# Patient Record
Sex: Male | Born: 1940 | Race: Black or African American | Hispanic: No | Smoking: Former smoker
Health system: Southern US, Community
[De-identification: ages and names within clinical notes are randomized; demographics above are authoritative.]

## PROBLEM LIST (undated history)

## (undated) DIAGNOSIS — C61 Malignant neoplasm of prostate: Secondary | ICD-10-CM

## (undated) DIAGNOSIS — J439 Emphysema, unspecified: Secondary | ICD-10-CM

## (undated) DIAGNOSIS — M109 Gout, unspecified: Secondary | ICD-10-CM

## (undated) HISTORY — DX: Malignant neoplasm of prostate: C61

## (undated) HISTORY — DX: Emphysema, unspecified: J43.9

## (undated) HISTORY — DX: Gout, unspecified: M10.9

## (undated) HISTORY — PX: EYE SURGERY: SHX253

---

## 2016-06-10 DIAGNOSIS — J431 Panlobular emphysema: Secondary | ICD-10-CM | POA: Insufficient documentation

## 2016-09-09 DIAGNOSIS — M722 Plantar fascial fibromatosis: Secondary | ICD-10-CM | POA: Insufficient documentation

## 2016-09-09 DIAGNOSIS — R251 Tremor, unspecified: Secondary | ICD-10-CM | POA: Insufficient documentation

## 2017-12-18 DIAGNOSIS — C61 Malignant neoplasm of prostate: Secondary | ICD-10-CM | POA: Diagnosis not present

## 2017-12-18 DIAGNOSIS — N3020 Other chronic cystitis without hematuria: Secondary | ICD-10-CM | POA: Diagnosis not present

## 2017-12-25 DIAGNOSIS — J449 Chronic obstructive pulmonary disease, unspecified: Secondary | ICD-10-CM | POA: Diagnosis not present

## 2018-01-25 DIAGNOSIS — J449 Chronic obstructive pulmonary disease, unspecified: Secondary | ICD-10-CM | POA: Diagnosis not present

## 2018-02-22 DIAGNOSIS — J449 Chronic obstructive pulmonary disease, unspecified: Secondary | ICD-10-CM | POA: Diagnosis not present

## 2018-04-07 DIAGNOSIS — C61 Malignant neoplasm of prostate: Secondary | ICD-10-CM | POA: Diagnosis not present

## 2018-04-07 DIAGNOSIS — E782 Mixed hyperlipidemia: Secondary | ICD-10-CM | POA: Diagnosis not present

## 2018-04-07 DIAGNOSIS — I10 Essential (primary) hypertension: Secondary | ICD-10-CM | POA: Diagnosis not present

## 2018-04-07 DIAGNOSIS — R7301 Impaired fasting glucose: Secondary | ICD-10-CM | POA: Diagnosis not present

## 2018-04-07 DIAGNOSIS — M1000 Idiopathic gout, unspecified site: Secondary | ICD-10-CM | POA: Diagnosis not present

## 2018-04-07 DIAGNOSIS — R7303 Prediabetes: Secondary | ICD-10-CM | POA: Diagnosis not present

## 2018-04-07 DIAGNOSIS — J449 Chronic obstructive pulmonary disease, unspecified: Secondary | ICD-10-CM | POA: Diagnosis not present

## 2018-04-19 DIAGNOSIS — N3020 Other chronic cystitis without hematuria: Secondary | ICD-10-CM | POA: Diagnosis not present

## 2018-04-19 DIAGNOSIS — C61 Malignant neoplasm of prostate: Secondary | ICD-10-CM | POA: Diagnosis not present

## 2018-04-23 DIAGNOSIS — I129 Hypertensive chronic kidney disease with stage 1 through stage 4 chronic kidney disease, or unspecified chronic kidney disease: Secondary | ICD-10-CM | POA: Diagnosis not present

## 2018-04-23 DIAGNOSIS — I9589 Other hypotension: Secondary | ICD-10-CM | POA: Diagnosis not present

## 2018-04-23 DIAGNOSIS — I959 Hypotension, unspecified: Secondary | ICD-10-CM | POA: Diagnosis not present

## 2018-04-23 DIAGNOSIS — N189 Chronic kidney disease, unspecified: Secondary | ICD-10-CM | POA: Diagnosis not present

## 2018-04-23 DIAGNOSIS — R0902 Hypoxemia: Secondary | ICD-10-CM | POA: Diagnosis not present

## 2018-04-23 DIAGNOSIS — J449 Chronic obstructive pulmonary disease, unspecified: Secondary | ICD-10-CM | POA: Diagnosis not present

## 2018-04-23 DIAGNOSIS — N289 Disorder of kidney and ureter, unspecified: Secondary | ICD-10-CM | POA: Diagnosis not present

## 2018-04-23 DIAGNOSIS — R0602 Shortness of breath: Secondary | ICD-10-CM | POA: Diagnosis not present

## 2018-04-23 DIAGNOSIS — I951 Orthostatic hypotension: Secondary | ICD-10-CM | POA: Diagnosis not present

## 2018-04-23 DIAGNOSIS — R531 Weakness: Secondary | ICD-10-CM | POA: Diagnosis not present

## 2018-04-23 DIAGNOSIS — R51 Headache: Secondary | ICD-10-CM | POA: Diagnosis not present

## 2018-07-25 DIAGNOSIS — J449 Chronic obstructive pulmonary disease, unspecified: Secondary | ICD-10-CM | POA: Diagnosis not present

## 2018-08-09 DIAGNOSIS — R7303 Prediabetes: Secondary | ICD-10-CM | POA: Diagnosis not present

## 2018-08-09 DIAGNOSIS — J449 Chronic obstructive pulmonary disease, unspecified: Secondary | ICD-10-CM | POA: Diagnosis not present

## 2018-08-09 DIAGNOSIS — I10 Essential (primary) hypertension: Secondary | ICD-10-CM | POA: Diagnosis not present

## 2018-08-09 DIAGNOSIS — Z6826 Body mass index (BMI) 26.0-26.9, adult: Secondary | ICD-10-CM | POA: Diagnosis not present

## 2018-08-09 DIAGNOSIS — E782 Mixed hyperlipidemia: Secondary | ICD-10-CM | POA: Diagnosis not present

## 2018-08-09 DIAGNOSIS — N183 Chronic kidney disease, stage 3 (moderate): Secondary | ICD-10-CM | POA: Diagnosis not present

## 2018-08-23 DIAGNOSIS — N3020 Other chronic cystitis without hematuria: Secondary | ICD-10-CM | POA: Diagnosis not present

## 2018-08-23 DIAGNOSIS — C61 Malignant neoplasm of prostate: Secondary | ICD-10-CM | POA: Diagnosis not present

## 2018-08-25 DIAGNOSIS — J449 Chronic obstructive pulmonary disease, unspecified: Secondary | ICD-10-CM | POA: Diagnosis not present

## 2018-09-24 DIAGNOSIS — J449 Chronic obstructive pulmonary disease, unspecified: Secondary | ICD-10-CM | POA: Diagnosis not present

## 2018-10-25 DIAGNOSIS — J449 Chronic obstructive pulmonary disease, unspecified: Secondary | ICD-10-CM | POA: Diagnosis not present

## 2018-11-16 DIAGNOSIS — J9601 Acute respiratory failure with hypoxia: Secondary | ICD-10-CM | POA: Diagnosis not present

## 2018-11-16 DIAGNOSIS — J441 Chronic obstructive pulmonary disease with (acute) exacerbation: Secondary | ICD-10-CM | POA: Diagnosis not present

## 2018-11-16 DIAGNOSIS — R0902 Hypoxemia: Secondary | ICD-10-CM | POA: Diagnosis not present

## 2018-11-18 DIAGNOSIS — J9601 Acute respiratory failure with hypoxia: Secondary | ICD-10-CM | POA: Diagnosis not present

## 2018-11-18 DIAGNOSIS — J441 Chronic obstructive pulmonary disease with (acute) exacerbation: Secondary | ICD-10-CM | POA: Diagnosis not present

## 2018-11-24 DIAGNOSIS — J449 Chronic obstructive pulmonary disease, unspecified: Secondary | ICD-10-CM | POA: Diagnosis not present

## 2018-11-25 DIAGNOSIS — Z0000 Encounter for general adult medical examination without abnormal findings: Secondary | ICD-10-CM | POA: Diagnosis not present

## 2018-11-25 DIAGNOSIS — N3941 Urge incontinence: Secondary | ICD-10-CM | POA: Diagnosis not present

## 2018-12-01 DIAGNOSIS — J449 Chronic obstructive pulmonary disease, unspecified: Secondary | ICD-10-CM | POA: Diagnosis not present

## 2018-12-23 DIAGNOSIS — N3020 Other chronic cystitis without hematuria: Secondary | ICD-10-CM | POA: Diagnosis not present

## 2018-12-23 DIAGNOSIS — C61 Malignant neoplasm of prostate: Secondary | ICD-10-CM | POA: Diagnosis not present

## 2018-12-25 DIAGNOSIS — J449 Chronic obstructive pulmonary disease, unspecified: Secondary | ICD-10-CM | POA: Diagnosis not present

## 2019-01-24 DIAGNOSIS — C61 Malignant neoplasm of prostate: Secondary | ICD-10-CM | POA: Diagnosis not present

## 2019-01-24 DIAGNOSIS — N3020 Other chronic cystitis without hematuria: Secondary | ICD-10-CM | POA: Diagnosis not present

## 2019-01-25 DIAGNOSIS — J449 Chronic obstructive pulmonary disease, unspecified: Secondary | ICD-10-CM | POA: Diagnosis not present

## 2019-02-23 DIAGNOSIS — J449 Chronic obstructive pulmonary disease, unspecified: Secondary | ICD-10-CM | POA: Diagnosis not present

## 2019-03-26 DIAGNOSIS — J449 Chronic obstructive pulmonary disease, unspecified: Secondary | ICD-10-CM | POA: Diagnosis not present

## 2019-04-05 DIAGNOSIS — N183 Chronic kidney disease, stage 3 (moderate): Secondary | ICD-10-CM | POA: Diagnosis not present

## 2019-04-05 DIAGNOSIS — I10 Essential (primary) hypertension: Secondary | ICD-10-CM | POA: Diagnosis not present

## 2019-04-05 DIAGNOSIS — R7303 Prediabetes: Secondary | ICD-10-CM | POA: Diagnosis not present

## 2019-04-05 DIAGNOSIS — C61 Malignant neoplasm of prostate: Secondary | ICD-10-CM | POA: Diagnosis not present

## 2019-04-05 DIAGNOSIS — Z1159 Encounter for screening for other viral diseases: Secondary | ICD-10-CM | POA: Diagnosis not present

## 2019-04-05 DIAGNOSIS — J449 Chronic obstructive pulmonary disease, unspecified: Secondary | ICD-10-CM | POA: Diagnosis not present

## 2019-04-05 DIAGNOSIS — E782 Mixed hyperlipidemia: Secondary | ICD-10-CM | POA: Diagnosis not present

## 2019-04-26 DIAGNOSIS — J449 Chronic obstructive pulmonary disease, unspecified: Secondary | ICD-10-CM | POA: Diagnosis not present

## 2019-04-26 DIAGNOSIS — N3020 Other chronic cystitis without hematuria: Secondary | ICD-10-CM | POA: Diagnosis not present

## 2019-04-26 DIAGNOSIS — C61 Malignant neoplasm of prostate: Secondary | ICD-10-CM | POA: Diagnosis not present

## 2019-05-27 DIAGNOSIS — J449 Chronic obstructive pulmonary disease, unspecified: Secondary | ICD-10-CM | POA: Diagnosis not present

## 2019-08-30 DIAGNOSIS — N3000 Acute cystitis without hematuria: Secondary | ICD-10-CM | POA: Diagnosis not present

## 2019-08-30 DIAGNOSIS — C61 Malignant neoplasm of prostate: Secondary | ICD-10-CM | POA: Diagnosis not present

## 2019-09-01 DIAGNOSIS — C61 Malignant neoplasm of prostate: Secondary | ICD-10-CM | POA: Diagnosis not present

## 2019-11-07 DIAGNOSIS — J449 Chronic obstructive pulmonary disease, unspecified: Secondary | ICD-10-CM | POA: Diagnosis not present

## 2019-11-07 DIAGNOSIS — N3941 Urge incontinence: Secondary | ICD-10-CM | POA: Diagnosis not present

## 2019-11-07 DIAGNOSIS — Z1159 Encounter for screening for other viral diseases: Secondary | ICD-10-CM | POA: Diagnosis not present

## 2019-12-13 DIAGNOSIS — J449 Chronic obstructive pulmonary disease, unspecified: Secondary | ICD-10-CM | POA: Diagnosis not present

## 2019-12-13 DIAGNOSIS — F322 Major depressive disorder, single episode, severe without psychotic features: Secondary | ICD-10-CM | POA: Diagnosis not present

## 2019-12-13 DIAGNOSIS — Z1159 Encounter for screening for other viral diseases: Secondary | ICD-10-CM | POA: Diagnosis not present

## 2019-12-15 ENCOUNTER — Ambulatory Visit (INDEPENDENT_AMBULATORY_CARE_PROVIDER_SITE_OTHER): Payer: PPO | Admitting: Critical Care Medicine

## 2019-12-15 ENCOUNTER — Other Ambulatory Visit: Payer: Self-pay

## 2019-12-15 ENCOUNTER — Encounter: Payer: Self-pay | Admitting: Critical Care Medicine

## 2019-12-15 VITALS — BP 142/78 | HR 97 | Temp 97.2°F | Ht 68.0 in | Wt 170.0 lb

## 2019-12-15 DIAGNOSIS — R062 Wheezing: Secondary | ICD-10-CM | POA: Diagnosis not present

## 2019-12-15 DIAGNOSIS — J9611 Chronic respiratory failure with hypoxia: Secondary | ICD-10-CM | POA: Diagnosis not present

## 2019-12-15 DIAGNOSIS — J449 Chronic obstructive pulmonary disease, unspecified: Secondary | ICD-10-CM

## 2019-12-15 DIAGNOSIS — R0600 Dyspnea, unspecified: Secondary | ICD-10-CM | POA: Diagnosis not present

## 2019-12-15 DIAGNOSIS — R0609 Other forms of dyspnea: Secondary | ICD-10-CM

## 2019-12-15 MED ORDER — STIOLTO RESPIMAT 2.5-2.5 MCG/ACT IN AERS: 2.0000 | INHALATION_SPRAY | Freq: Every day | RESPIRATORY_TRACT | 0 refills | Status: DC

## 2019-12-15 MED ORDER — STIOLTO RESPIMAT 2.5-2.5 MCG/ACT IN AERS: 2.0000 | INHALATION_SPRAY | Freq: Every day | RESPIRATORY_TRACT | 11 refills | Status: DC

## 2019-12-15 MED ORDER — ALBUTEROL SULFATE HFA 108 (90 BASE) MCG/ACT IN AERS: 2.0000 | INHALATION_SPRAY | RESPIRATORY_TRACT | 5 refills | Status: DC | PRN

## 2019-12-15 NOTE — Addendum Note (Signed)
Addended by: Jannette Spanner on: 12/15/2019 11:50 AM   Modules accepted: Orders

## 2019-12-15 NOTE — Patient Instructions (Addendum)
Thank you for visiting Dr. Carlis Abbott at [facility]. We recommend the following: Orders Placed This Encounter  Procedures  . Pulmonary function test   Orders Placed This Encounter  Procedures  . Pulmonary function test    Standing Status:   Future    Standing Expiration Date:   12/14/2020    Order Specific Question:   Where should this test be performed?    Answer:   [facility]    Order Specific Question:   Full PFT: includes the following: basic spirometry, spirometry pre & post bronchodilator, diffusion capacity (DLCO), lung volumes    Answer:   Full PFT    Meds ordered this encounter  Medications  . Tiotropium Bromide-Olodaterol (STIOLTO RESPIMAT) 2.5-2.5 MCG/ACT AERS    Sig: Inhale 2 puffs into the lungs daily.    Dispense:  4 g    Refill:  11  . albuterol (VENTOLIN HFA) 108 (90 Base) MCG/ACT inhaler    Sig: Inhale 2 puffs into the lungs every 4 (four) hours as needed for wheezing or shortness of breath.    Dispense:  18 g    Refill:  5     You should use your oxygen (4L)  anytime you are up walking around and anytime you are sleeping.    Return in about 6 weeks (around 01/26/2020).    Please do your part to reduce the spread of COVID-19.   Covid 19 vaccine:  [place]: [id number] vaccinations begin Saturday at [facility] in [place] and are by appointment only. This will be for anyone 62 and older. People can be vaccinated at this time only if they register in advance. That can be done online at [email or web address] or by calling [phone].  [place]: [phone] and selecting Option 2 beginning Friday, January 8 at 8:00 AM. Appointments are required.  The locations are as follows: [facility], [place], [place], [place] [address] [facility], [address], [place], [place] [address] [place] [facility], [id number], [place], [place] [address] Please call [phone] and select Option 2 beginning Friday, January 8 at 8:00 AM. Appointments are required. Walk-ins will NOT be accepted.

## 2019-12-15 NOTE — Progress Notes (Signed)
Synopsis: Referred in January 2021 for COPD by No ref. provider found.  Subjective:   PATIENT ID: Russell Marquez. GENDER: male DOB: [birth year], MRN: [id number]  Chief Complaint  Patient presents with  . Pulmonary Consult    Russell Marquez is a 79 year old gentleman who presents for evaluation of dyspnea on exertion.  Russell Marquez has previously been diagnosed clinically by his primary care physician with COPD.  Russell Marquez has tried multiple inhalers in the past without perceived benefit.  Russell Marquez thinks albuterol works better than long-acting inhalers, but uses it about once per day.  Russell Marquez has severe dyspnea with exertion limiting his activity.  Russell Marquez has shortness of breath even with talking.  Russell Marquez is wheezing, but denies coughing or sputum production.  Russell Marquez quit smoking about 13 years ago after about 35 years x 0.75 ppd.  Russell Marquez never had dyspnea until a few years ago.  Russell Marquez denies significant allergy symptoms, heartburn, chest pain, or leg edema.  Russell Marquez has been wearing a mask and generally staying home due to Covid.  Russell Marquez has had pneumonia shots in the past with his primary care provider, but has not had his seasonal flu shot or Covid vaccine.  Days worried about the Covid vaccine.  His father had COPD.  Russell Marquez has multiple siblings who have died last few years and a sister who is in poor health.  Russell Marquez has had insomnia, anxiety, and frequent tearfulness.  Russell Marquez was recently started on Zoloft.  Although Russell Marquez is trying to help take care of his sister and facilitate her remaining home, Russell Marquez knows that Russell Marquez is limited due to his symptoms.     Past Medical History:  Diagnosis Date  . Cancer of prostate ([place])   . Emphysema of lung ([place])   . Gout   . Hypertension   . Hypertension      Family History  Problem Relation Age of Onset  . Heart disease Mother   . COPD Father   . HIV Brother      Past Surgical History:  Procedure Laterality Date  . EYE SURGERY      Social History   Socioeconomic History  . Marital status: Widowed    Spouse  name: Not on file  . Number of children: Not on file  . Years of education: Not on file  . Highest education level: Not on file  Occupational History  . Not on file  Tobacco Use  . Smoking status: Former Smoker    Packs/day: 1.00    Years: 12.00    Pack years: 12.00    Types: Cigarettes  . Smokeless tobacco: Never Used  Substance and Sexual Activity  . Alcohol use: Not on file  . Drug use: Not on file  . Sexual activity: Not on file  Other Topics Concern  . Not on file  Social History Narrative  . Not on file   Social Determinants of Health   Financial Resource Strain:   . Difficulty of Paying Living Expenses: Not on file  Food Insecurity:   . Worried About Charity fundraiser in the Last Year: Not on file  . Ran Out of Food in the Last Year: Not on file  Transportation Needs:   . Lack of Transportation (Medical): Not on file  . Lack of Transportation (Non-Medical): Not on file  Physical Activity:   . Days of Exercise per Week: Not on file  . Minutes of Exercise per Session: Not on file  Stress:   . Feeling  of Stress : Not on file  Social Connections:   . Frequency of Communication with Friends and Family: Not on file  . Frequency of Social Gatherings with Friends and Family: Not on file  . Attends Religious Services: Not on file  . Active Member of Clubs or Organizations: Not on file  . Attends Archivist Meetings: Not on file  . Marital Status: Not on file  Intimate Partner Violence:   . Fear of Current or Ex-Partner: Not on file  . Emotionally Abused: Not on file  . Physically Abused: Not on file  . Sexually Abused: Not on file     No Known Allergies    There is no immunization history on file for this patient.  Outpatient Medications Prior to Visit  Medication Sig Dispense Refill  . albuterol (VENTOLIN HFA) 108 (90 Base) MCG/ACT inhaler Inhale into the lungs every 6 (six) hours as needed for wheezing or shortness of breath.    Marland Kitchen atorvastatin  (LIPITOR) 40 MG tablet     . bicalutamide (CASODEX) 50 MG tablet     . megestrol (MEGACE) 40 MG tablet     . oxybutynin (DITROPAN-XL) 5 MG 24 hr tablet     . pregabalin (LYRICA) 50 MG capsule Take by mouth.    . primidone (MYSOLINE) 50 MG tablet 1 or 2 a day    . sertraline (ZOLOFT) 50 MG tablet Take 50 mg by mouth daily.    . tamsulosin (FLOMAX) 0.4 MG CAPS capsule     . Tiotropium Bromide Monohydrate (SPIRIVA RESPIMAT) 2.5 MCG/ACT AERS     . valsartan (DIOVAN) 80 MG tablet     . valsartan-hydrochlorothiazide (DIOVAN-HCT) 160-25 MG tablet      No facility-administered medications prior to visit.    Review of Systems  Respiratory: Positive for shortness of breath and wheezing. Negative for cough.   Cardiovascular: Negative for chest pain and leg swelling.  Genitourinary: Positive for frequency.       Nocturia  Endo/Heme/Allergies: Negative for environmental allergies.  Psychiatric/Behavioral: Positive for depression. The patient is nervous/anxious and has insomnia.      Objective:   Vitals:   12/15/19 1039  BP: (!) 142/78  Pulse: 97  Temp: (!) 97.2 F (36.2 C)  TempSrc: Oral  SpO2: (!) 86%  Weight: 170 lb (77.1 kg)  Height: 5\' 8"  (1.727 m)   (!) 86% on  RA    95% on RA  before walking BMI Readings from Last 3 Encounters:  12/15/19 25.85 kg/m   Wt Readings from Last 3 Encounters:  12/15/19 170 lb (77.1 kg)    Physical Exam Vitals reviewed.  Constitutional:      General: Russell Marquez is not in acute distress.    Appearance: Russell Marquez is not ill-appearing.  HENT:     Head: Normocephalic and atraumatic.     Nose:     Comments: Deferred due to masking requirement.    Mouth/Throat:     Comments: Deferred due to masking requirement. Eyes:     General: No scleral icterus. Cardiovascular:     Rate and Rhythm: Normal rate and regular rhythm.     Heart sounds: No murmur.  Pulmonary:     Comments: Mild tachypnea, minimally truncated speech.  Reduced breath sounds with prolonged  exhalation, no wheezing. Abdominal:     General: There is no distension.     Palpations: Abdomen is soft.     Tenderness: There is no abdominal tenderness.  Musculoskeletal:  General: No swelling or deformity.     Cervical back: Neck supple.  Lymphadenopathy:     Cervical: No cervical adenopathy.  Skin:    General: Skin is warm and dry.     Findings: No rash.  Neurological:     General: No focal deficit present.     Mental Status: Russell Marquez is alert.     Motor: No weakness.     Coordination: Coordination normal.  Psychiatric:     Comments: Cooperative with exam, tearful when discussing his family.      CBC No results found for: WBC, RBC, HGB, HCT, PLT, MCV, MCH, MCHC, RDW, LYMPHSABS, MONOABS, EOSABS, BASOSABS  CHEMISTRY No results for input(s): NA, K, CL, CO2, GLUCOSE, BUN, CREATININE, CALCIUM, MG, PHOS in the last 168 hours. CrCl cannot be calculated (No successful lab value found.).   Chest Imaging- films reviewed: None available  Pulmonary Functions Testing Results: No flowsheet data found.     Assessment & Plan:     ICD-10-CM   1. Chronic obstructive pulmonary disease, unspecified COPD type ([place])  J44.9 Pulmonary function test    Ambulatory Referral for DME  2. DOE (dyspnea on exertion)  R06.00 Pulmonary function test  3. Wheezing  R06.2 Pulmonary function test  4. Chronic respiratory failure with hypoxia ([facility])  J96.11     Severe dyspnea on exertion and wheezing-likely COPD given previous tobacco abuse -Start Stiolto once daily.  Russell Marquez was instructed to use this no matter how good or bad Russell Marquez feels.  Sample given with inhaler training provided. -Continue albuterol as needed -PFTs -Recommend seasonal flu vaccine-Russell Marquez declined today -Requested that Russell Marquez check with his primary care physician to ensure that Russell Marquez is up-to-date on his pneumonia vaccines -Recommend Russell Marquez receive the COVID-19 vaccine.  We discussed the potential benefits and relatively mild side effects.  Russell Marquez has  been provided with the information to contact [facility] or [facility] about getting an appointment for vaccination when Russell Marquez is ready. -Continue Covid precautions-social distancing, mask wearing, handwashing. -Ambulate in the office to qualify for oxygen today  Chronic hypoxic respiratory failure -Ambulate in the office to qualify for oxygen-- 4L needed with activity and sleep.   RTC in 6 weeks.    Current Outpatient Medications:  .  albuterol (VENTOLIN HFA) 108 (90 Base) MCG/ACT inhaler, Inhale into the lungs every 6 (six) hours as needed for wheezing or shortness of breath., Disp: , Rfl:  .  atorvastatin (LIPITOR) 40 MG tablet, , Disp: , Rfl:  .  bicalutamide (CASODEX) 50 MG tablet, , Disp: , Rfl:  .  megestrol (MEGACE) 40 MG tablet, , Disp: , Rfl:  .  oxybutynin (DITROPAN-XL) 5 MG 24 hr tablet, , Disp: , Rfl:  .  pregabalin (LYRICA) 50 MG capsule, Take by mouth., Disp: , Rfl:  .  primidone (MYSOLINE) 50 MG tablet, 1 or 2 a day, Disp: , Rfl:  .  sertraline (ZOLOFT) 50 MG tablet, Take 50 mg by mouth daily., Disp: , Rfl:  .  tamsulosin (FLOMAX) 0.4 MG CAPS capsule, , Disp: , Rfl:  .  Tiotropium Bromide Monohydrate (SPIRIVA RESPIMAT) 2.5 MCG/ACT AERS, , Disp: , Rfl:  .  valsartan (DIOVAN) 80 MG tablet, , Disp: , Rfl:  .  valsartan-hydrochlorothiazide (DIOVAN-HCT) 160-25 MG tablet, , Disp: , Rfl:  .  albuterol (VENTOLIN HFA) 108 (90 Base) MCG/ACT inhaler, Inhale 2 puffs into the lungs every 4 (four) hours as needed for wheezing or shortness of breath., Disp: 18 g, Rfl: 5 .  Tiotropium Bromide-Olodaterol (STIOLTO RESPIMAT) 2.5-2.5 MCG/ACT AERS, Inhale 2 puffs into the lungs daily., Disp: 4 g, Rfl: 11   I spent 46 minutes on this encounter, including face to face time and non-face to face time spent reviewing records, charting, coordinating care, etc.   Julian Hy, DO [facility] 12/15/2019 11:24 AM

## 2019-12-20 ENCOUNTER — Telehealth: Payer: Self-pay | Admitting: Critical Care Medicine

## 2019-12-20 DIAGNOSIS — R0600 Dyspnea, unspecified: Secondary | ICD-10-CM

## 2019-12-20 DIAGNOSIS — R0609 Other forms of dyspnea: Secondary | ICD-10-CM

## 2019-12-20 NOTE — Telephone Encounter (Signed)
Spoke with pt. He needs an oxygen order to go to [place]. We originally sent his order to [facility], his insurance is not in network with [facility]. New order has been placed for [facility]. Nothing further was needed.

## 2019-12-21 ENCOUNTER — Telehealth: Payer: Self-pay | Admitting: Critical Care Medicine

## 2019-12-21 DIAGNOSIS — J449 Chronic obstructive pulmonary disease, unspecified: Secondary | ICD-10-CM | POA: Diagnosis not present

## 2019-12-21 NOTE — Telephone Encounter (Signed)
Spoke with pharm  PA submitted via [place]: [id number] - PA Case ID: [id number]

## 2019-12-22 NOTE — Telephone Encounter (Signed)
I called [name] and advised them to run the [facility] prescription because it looks like the PA was approved. She ran the Rx and it went through. SHe will advise pt about Rx. Nothing further is needed.    Russell Marquez ([id number])  This request has received a favorable outcome and is approved. Please note any additional information provided by [employer] at the bottom of your screen. You will also receive a faxed copy of the determination. If you have any questions please contact [employer] at [phone].

## 2019-12-27 DIAGNOSIS — F322 Major depressive disorder, single episode, severe without psychotic features: Secondary | ICD-10-CM | POA: Diagnosis not present

## 2019-12-27 DIAGNOSIS — Z1159 Encounter for screening for other viral diseases: Secondary | ICD-10-CM | POA: Diagnosis not present

## 2019-12-28 ENCOUNTER — Other Ambulatory Visit: Payer: Self-pay

## 2019-12-28 ENCOUNTER — Telehealth: Payer: Self-pay | Admitting: Critical Care Medicine

## 2019-12-28 MED ORDER — ANORO ELLIPTA 62.5-25 MCG/INH IN AEPB: 1.0000 | INHALATION_SPRAY | Freq: Every day | RESPIRATORY_TRACT | 3 refills | Status: DC

## 2019-12-28 NOTE — Telephone Encounter (Signed)
Dr Carlis Abbott- did you want him to continue anoro as well as the stiolto? It's still on his med list and AVS did not mention d/c  Please advise thanks

## 2019-12-30 DIAGNOSIS — N3020 Other chronic cystitis without hematuria: Secondary | ICD-10-CM | POA: Diagnosis not present

## 2019-12-30 DIAGNOSIS — C61 Malignant neoplasm of prostate: Secondary | ICD-10-CM | POA: Diagnosis not present

## 2019-12-30 NOTE — Telephone Encounter (Signed)
Spoke with Caryl Pina and notified of recs per Dr Carlis Abbott  I have updated MAR and taken off the Anoro

## 2019-12-30 NOTE — Telephone Encounter (Signed)
Sorry I didn't see this earlier. No he only needs the Stiolto, not both. Thanks!  LPC

## 2020-01-09 ENCOUNTER — Telehealth: Payer: Self-pay

## 2020-01-09 ENCOUNTER — Other Ambulatory Visit: Payer: Self-pay | Admitting: Legal Medicine

## 2020-01-09 DIAGNOSIS — H538 Other visual disturbances: Secondary | ICD-10-CM

## 2020-01-09 NOTE — Telephone Encounter (Signed)
Patient has noticed blurred vision and it is getting worse. He wants to be referral to eye doctor here in [place].

## 2020-01-21 DIAGNOSIS — J449 Chronic obstructive pulmonary disease, unspecified: Secondary | ICD-10-CM | POA: Diagnosis not present

## 2020-01-25 DIAGNOSIS — N1831 Chronic kidney disease, stage 3a: Secondary | ICD-10-CM | POA: Insufficient documentation

## 2020-01-25 DIAGNOSIS — E782 Mixed hyperlipidemia: Secondary | ICD-10-CM | POA: Insufficient documentation

## 2020-01-25 DIAGNOSIS — M1009 Idiopathic gout, multiple sites: Secondary | ICD-10-CM | POA: Insufficient documentation

## 2020-01-25 DIAGNOSIS — I10 Essential (primary) hypertension: Secondary | ICD-10-CM | POA: Insufficient documentation

## 2020-01-25 DIAGNOSIS — C61 Malignant neoplasm of prostate: Secondary | ICD-10-CM | POA: Insufficient documentation

## 2020-01-25 DIAGNOSIS — J9611 Chronic respiratory failure with hypoxia: Secondary | ICD-10-CM | POA: Insufficient documentation

## 2020-01-25 DIAGNOSIS — J9601 Acute respiratory failure with hypoxia: Secondary | ICD-10-CM | POA: Insufficient documentation

## 2020-01-26 ENCOUNTER — Ambulatory Visit (INDEPENDENT_AMBULATORY_CARE_PROVIDER_SITE_OTHER): Payer: PPO | Admitting: Legal Medicine

## 2020-01-26 ENCOUNTER — Encounter: Payer: Self-pay | Admitting: Legal Medicine

## 2020-01-26 ENCOUNTER — Ambulatory Visit: Payer: PPO | Admitting: Legal Medicine

## 2020-01-26 ENCOUNTER — Other Ambulatory Visit: Payer: Self-pay

## 2020-01-26 DIAGNOSIS — N1831 Chronic kidney disease, stage 3a: Secondary | ICD-10-CM

## 2020-01-26 DIAGNOSIS — I10 Essential (primary) hypertension: Secondary | ICD-10-CM | POA: Diagnosis not present

## 2020-01-26 DIAGNOSIS — C61 Malignant neoplasm of prostate: Secondary | ICD-10-CM | POA: Diagnosis not present

## 2020-01-26 DIAGNOSIS — M1A09X0 Idiopathic chronic gout, multiple sites, without tophus (tophi): Secondary | ICD-10-CM | POA: Diagnosis not present

## 2020-01-26 DIAGNOSIS — E782 Mixed hyperlipidemia: Secondary | ICD-10-CM

## 2020-01-26 NOTE — Assessment & Plan Note (Signed)
AN INDIVIDUAL CARE PLAN was established and reinforced today.  The patient's status was assessed using clinical findings on exam, labs, and other diagnostic testing. Patient's success at meeting treatment goals based on disease specific evidence-bassed guidelines and found to be in good control. He is stage 4 prostate cancer. RECOMMENDATIONS include maintaining present medicines and treatment. I will Refer to Dr. Nila Nephew since Dr. Comer Locket is retiring.

## 2020-01-26 NOTE — Assessment & Plan Note (Signed)
AN INDIVIDUAL CARE PLAN was established and reinforced today.  The patient's status was assessed using clinical findings on exam, labs, and other diagnostic testing. Patient's success at meeting treatment goals based on disease specific evidence-bassed guidelines and found to be in good control. RECOMMENDATIONS include maintaining present medicines and treatment. No recent gout exacerbations.

## 2020-01-26 NOTE — Assessment & Plan Note (Signed)

## 2020-01-26 NOTE — Assessment & Plan Note (Signed)

## 2020-01-26 NOTE — Assessment & Plan Note (Signed)
AN INDIVIDUAL CARE PLAN was established and reinforced today.  The patient's status was assessed using clinical findings on exam, labs, and other diagnostic testing. Patient's success at meeting treatment goals based on disease specific evidence-bassed guidelines and found to be in good control. RECOMMENDATIONS include maintaining present medicines and treatment. 

## 2020-01-26 NOTE — Progress Notes (Signed)
Established Patient Office Visit  Subjective:  Patient ID: Russell Marquez., male    DOB: November 06, 1941  Age: 79 y.o. MRN: [id number]  CC:  Chief Complaint  Patient presents with  . Hypertension  . Hyperlipidemia    HPI Russell Marquez. presents for Chronic visit.  He is still having problems with daughter who has dementia.  Patient presents for follow up of hypertension.  Patient tolerating valsartan well with side effects.  Patient was diagnosed with hypertension 2010 so has been treated for hypertension for 10 years.Patient is working on maintaining diet and exercise regimen and follows up as directed. Complication include none.  Patient presents with hyperlipidemia.  Compliance with treatment has been good; patient takes medicines as directed, maintains low cholesterol diet, follows up as directed, and maintains exercise regimen.  Patient is using atorvastatin without problems.  Past Medical History:  Diagnosis Date  . Cancer of prostate ([place])   . 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 Gout   . Hypertension   . Hypertension     Past Surgical History:  Procedure Laterality Date  . EYE SURGERY      Family History  Problem Relation Age of Onset  . Heart disease Mother   . COPD Father   . HIV Brother     Social History   Socioeconomic History  . Marital status: Widowed    Spouse name: Not on file  . Number of children: Not on file  . Years of education: Not on file  . Highest education level: Not on file  Occupational History  . Not on file  Tobacco Use  . Smoking status: Former Smoker    Packs/day: 1.00    Years: 12.00    Pack years: 12.00    Types: Cigarettes  . Smokeless tobacco: Never Used  Substance and Sexual Activity  . Alcohol use: Not on file  . Drug use: Not on file  . Sexual activity: Not on file  Other Topics Concern  . Not on file  Social History Narrative  . Not on file   Social Determinants of Health   Financial Resource Strain:   .  Difficulty of Paying Living Expenses: Not on file  Food Insecurity:   . Worried About Charity fundraiser in the Last Year: Not on file  . Ran Out of Food in the Last Year: Not on file  Transportation Needs:   . Lack of Transportation (Medical): Not on file  . Lack of Transportation (Non-Medical): Not on file  Physical Activity:   . Days of Exercise per Week: Not on file  . Minutes of Exercise per Session: Not on file  Stress:   . Feeling of Stress : Not on file  Social Connections:   . Frequency of Communication with Friends and Family: Not on file  . Frequency of Social Gatherings with Friends and Family: Not on file  . Attends Religious Services: Not on file  . Active Member of Clubs or Organizations: Not on file  . Attends Archivist Meetings: Not on file  . Marital Status: Not on file  Intimate Partner Violence:   . Fear of Current or Ex-Partner: Not on file  . Emotionally Abused: Not on file  . Physically Abused: Not on file  . Sexually Abused: Not on file    Outpatient Medications Prior to Visit  Medication Sig Dispense Refill  . albuterol (VENTOLIN HFA) 108 (90 Base) MCG/ACT inhaler Inhale into the lungs every  6 (six) hours as needed for wheezing or shortness of breath.    Marland Kitchen atorvastatin (LIPITOR) 40 MG tablet     . oxybutynin (DITROPAN-XL) 5 MG 24 hr tablet     . OXYGEN Inhale 2 L into the lungs.    . sertraline (ZOLOFT) 50 MG tablet Take 50 mg by mouth daily.    . tamsulosin (FLOMAX) 0.4 MG CAPS capsule     . Tiotropium Bromide Monohydrate (SPIRIVA RESPIMAT) 2.5 MCG/ACT AERS     . valsartan (DIOVAN) 80 MG tablet     . XTANDI 40 MG capsule Take 160 mg by mouth daily.    Marland Kitchen oxybutynin (DITROPAN) 5 MG tablet Take 5 mg by mouth 3 (three) times daily.    . Tiotropium Bromide-Olodaterol 2.5-2.5 MCG/ACT AERS Inhale into the lungs.    . umeclidinium-vilanterol (ANORO ELLIPTA) 62.5-25 MCG/INH AEPB Inhale 1 puff into the lungs daily.    . valsartan (DIOVAN) 80 MG  tablet Take 80 mg by mouth daily.    . bicalutamide (CASODEX) 50 MG tablet     . albuterol (VENTOLIN HFA) 108 (90 Base) MCG/ACT inhaler Inhale 2 puffs into the lungs every 4 (four) hours as needed for wheezing or shortness of breath. 18 g 5  . megestrol (MEGACE) 40 MG tablet     . pregabalin (LYRICA) 50 MG capsule Take by mouth.    . primidone (MYSOLINE) 50 MG tablet 1 or 2 a day    . valsartan-hydrochlorothiazide (DIOVAN-HCT) 160-25 MG tablet      No facility-administered medications prior to visit.    No Known Allergies  ROS Review of Systems  Constitutional: Negative.   HENT: Negative.   Eyes: Negative.   Respiratory: Negative.   Cardiovascular: Negative.   Gastrointestinal: Negative.   Endocrine: Negative.   Genitourinary: Negative.   Musculoskeletal: Negative.   Skin: Negative.   Neurological: Negative.   Psychiatric/Behavioral: Negative.       Objective:    Physical Exam  Constitutional: He is oriented to person, place, and time. He appears well-developed and well-nourished.  HENT:  Head: Normocephalic and atraumatic.  Eyes: Pupils are equal, round, and reactive to light. Conjunctivae and EOM are normal.  Cardiovascular: Normal rate, regular rhythm and normal heart sounds.  Pulmonary/Chest: Effort normal and breath sounds normal.  Abdominal: Soft. Bowel sounds are normal.  Musculoskeletal:        General: Normal range of motion.     Cervical back: Normal range of motion and neck supple.  Neurological: He is alert and oriented to person, place, and time. He has normal reflexes.  Skin: Skin is warm and dry.  Vitals reviewed.   BP 130/84   Pulse 97   Temp 97.7 F (36.5 C)   Ht '5\' 8"'  (1.727 m)   Wt 174 lb 6.4 oz (79.1 kg)   SpO2 (!) 84%   BMI 26.52 kg/m  Wt Readings from Last 3 Encounters:  01/26/20 174 lb 6.4 oz (79.1 kg)  12/15/19 170 lb (77.1 kg)     Health Maintenance Due  Topic Date Due  . TETANUS/TDAP  11/14/1961  . PNA vac Low Risk Adult (1  of 2 - PCV13) 11/15/2007  . INFLUENZA VACCINE  07/02/2019    There are no preventive care reminders to display for this patient.  No results found for: TSH No results found for: WBC, HGB, HCT, MCV, PLT No results found for: NA, K, CHLORIDE, CO2, GLUCOSE, BUN, CREATININE, BILITOT, ALKPHOS, AST, ALT, PROT, ALBUMIN, CALCIUM, ANIONGAP, EGFR,  GFR No results found for: CHOL No results found for: HDL No results found for: LDLCALC No results found for: TRIG No results found for: CHOLHDL No results found for: HGBA1C    Assessment & Plan:   Problem List Items Addressed This Visit      Cardiovascular and Mediastinum   Benign hypertension    An individual care plan was established and reinforced today.  The patient's status was assessed using clinical findings on exam and labs or diagnostic tests. The patient's success at meeting treatment goals on disease specific evidence-based guidelines and found to be well controlled. SELF MANAGEMENT: The patient and I together assessed ways to personally work towards obtaining the recommended goals. RECOMMENDATIONS: avoid decongestants found in common cold remedies, decrease consumption of alcohol, perform routine monitoring of BP with home BP cuff, exercise, reduction of dietary salt, take medicines as prescribed, try not to miss doses and quit smoking.  Regular exercise and maintaining a healthy weight is needed.  Stress reduction may help. A CLINICAL SUMMARY including written plan identify barriers to care unique to individual due to social or financial issues.  We attempt to mutually creat solutions for individual and family understanding.      Relevant Orders   CBC with Differential   Comprehensive metabolic panel     Genitourinary   Chronic kidney disease, stage 3a    AN INDIVIDUAL CARE PLAN was established and reinforced today.  The patient's status was assessed using clinical findings on exam, labs, and other diagnostic testing. Patient's success  at meeting treatment goals based on disease specific evidence-bassed guidelines and found to be in good control. RECOMMENDATIONS include maintaining present medicines and treatment.      Malignant neoplasm of prostate ([place])    AN INDIVIDUAL CARE PLAN was established and reinforced today.  The patient's status was assessed using clinical findings on exam, labs, and other diagnostic testing. Patient's success at meeting treatment goals based on disease specific evidence-bassed guidelines and found to be in good control. He is stage 4 prostate cancer. RECOMMENDATIONS include maintaining present medicines and treatment. I will Refer to Dr. Nila Nephew since Dr. Comer Locket is retiring.      Relevant Medications   XTANDI 40 MG capsule   Other Relevant Orders   Ambulatory referral to Urology   PSA     Other   Mixed hyperlipidemia    AN INDIVIDUAL CARE PLAN was established and reinforced today.  The patient's status was assessed using clinical findings on exam, lab and other diagnostic tests. The patient's disease status was assessed based on evidence-based guidelines and found to be well controlled. MEDICATIONS were reviewed. SELF MANAGEMENT GOALS have been discussed and patient's success at attaining the goal of low cholesterol was assessed. RECOMMENDATION given include regular exercise 3 days a week and low cholesterol/low fat diet. CLINICAL SUMMARY including written plan to identify barriers unique to the patient due to social or economic  reasons was discussed.      Relevant Orders   Lipid Panel   Idiopathic gout of multiple sites    AN INDIVIDUAL CARE PLAN was established and reinforced today.  The patient's status was assessed using clinical findings on exam, labs, and other diagnostic testing. Patient's success at meeting treatment goals based on disease specific evidence-bassed guidelines and found to be in good control. RECOMMENDATIONS include maintaining present medicines and treatment. No  recent gout exacerbations.         No orders of the defined types were placed in this  encounter.   Follow-up: Return in about 4 months (around 05/25/2020) for fasting.    Reinaldo Meeker, MD

## 2020-01-27 LAB — COMPREHENSIVE METABOLIC PANEL
ALT: 13 IU/L (ref 0–44)
AST: 15 IU/L (ref 0–40)
Albumin/Globulin Ratio: 1.8 (ref 1.2–2.2)
Albumin: 4.5 g/dL (ref 3.7–4.7)
Alkaline Phosphatase: 103 IU/L (ref 39–117)
BUN/Creatinine Ratio: 12 (ref 10–24)
BUN: 17 mg/dL (ref 8–27)
Bilirubin Total: 1.2 mg/dL (ref 0.0–1.2)
CO2: 24 mmol/L (ref 20–29)
Calcium: 10.2 mg/dL (ref 8.6–10.2)
Chloride: 105 mmol/L (ref 96–106)
Creatinine, Ser: 1.37 mg/dL — ABNORMAL HIGH (ref 0.76–1.27)
GFR calc Af Amer: 57 mL/min/{1.73_m2} — ABNORMAL LOW (ref >59)
GFR calc non Af Amer: 49 mL/min/{1.73_m2} — ABNORMAL LOW (ref >59)
Globulin, Total: 2.5 g/dL (ref 1.5–4.5)
Glucose: 104 mg/dL — ABNORMAL HIGH (ref 65–99)
Potassium: 4.6 mmol/L (ref 3.5–5.2)
Sodium: 144 mmol/L (ref 134–144)
Total Protein: 7 g/dL (ref 6.0–8.5)

## 2020-01-27 LAB — CBC WITH DIFFERENTIAL/PLATELET
Basophils Absolute: 0 10*3/uL (ref 0.0–0.2)
Basos: 1 %
EOS (ABSOLUTE): 0.1 10*3/uL (ref 0.0–0.4)
Eos: 2 %
Hematocrit: 42.6 % (ref 37.5–51.0)
Hemoglobin: 14.3 g/dL (ref 13.0–17.7)
Immature Grans (Abs): 0 10*3/uL (ref 0.0–0.1)
Immature Granulocytes: 0 %
Lymphocytes Absolute: 1.7 10*3/uL (ref 0.7–3.1)
Lymphs: 46 %
MCH: 31.9 pg (ref 26.6–33.0)
MCHC: 33.6 g/dL (ref 31.5–35.7)
MCV: 95 fL (ref 79–97)
Monocytes Absolute: 0.5 10*3/uL (ref 0.1–0.9)
Monocytes: 14 %
Neutrophils Absolute: 1.4 10*3/uL (ref 1.4–7.0)
Neutrophils: 37 %
Platelets: 166 10*3/uL (ref 150–450)
RBC: 4.48 x10E6/uL (ref 4.14–5.80)
RDW: 13.8 % (ref 11.6–15.4)
WBC: 3.7 10*3/uL (ref 3.4–10.8)

## 2020-01-27 LAB — LIPID PANEL
Chol/HDL Ratio: 2.5 ratio (ref 0.0–5.0)
Cholesterol, Total: 151 mg/dL (ref 100–199)
HDL: 61 mg/dL (ref >39)
LDL Chol Calc (NIH): 77 mg/dL (ref 0–99)
Triglycerides: 65 mg/dL (ref 0–149)
VLDL Cholesterol Cal: 13 mg/dL (ref 5–40)

## 2020-01-27 LAB — PSA: Prostate Specific Ag, Serum: 1.3 ng/mL (ref 0.0–4.0)

## 2020-01-27 LAB — CARDIOVASCULAR RISK ASSESSMENT

## 2020-01-27 NOTE — Progress Notes (Signed)
No anemia, glucose 104, kidney functions stable, cholesterol normal, PSA normal lp

## 2020-01-31 ENCOUNTER — Other Ambulatory Visit: Payer: Self-pay | Admitting: Legal Medicine

## 2020-02-07 ENCOUNTER — Other Ambulatory Visit (HOSPITAL_COMMUNITY)
Admission: RE | Admit: 2020-02-07 | Discharge: 2020-02-07 | Disposition: A | Discharge status: Home / Self Care | Payer: PPO | Source: Ambulatory Visit | Attending: Critical Care Medicine | Admitting: Critical Care Medicine

## 2020-02-07 DIAGNOSIS — Z01812 Encounter for preprocedural laboratory examination: Secondary | ICD-10-CM | POA: Diagnosis not present

## 2020-02-07 DIAGNOSIS — Z20822 Contact with and (suspected) exposure to covid-19: Secondary | ICD-10-CM | POA: Insufficient documentation

## 2020-02-07 LAB — SARS CORONAVIRUS 2 (TAT 6-24 HRS): SARS Coronavirus 2: NEGATIVE

## 2020-02-09 NOTE — Progress Notes (Addendum)
@Patient  ID: Russell Cluster., male    DOB: 10/23/1941, 79 y.o.   MRN: [id number]  Chief Complaint  Patient presents with  . Follow-up    F/U after PFT. States his breathing has been stable since his last visit in January 2021.     Referring provider: Lillard Anes  HPI:  79 year old male former smoker followed in our office for suspected COPD, dyspnea on exertion  PMH: Hyperlipidemia, hypertension, chronic kidney disease Smoker/ Smoking History: Former smoker.  27-pack-year smoking history. Maintenance: Stiolto Respimat Pt of: Dr. Carlis Abbott  02/10/2020  - Visit   79 year old male former smoker initially consulted with our office in January/2021 for dyspnea on exertion and suspected COPD.  Patient is a former smoker.  Quit around 1998.  27-pack-year smoking history.  He has noticed improvement in his breathing since being started on Stiolto Respimat.  Walk in office today patient did not require any oxygen which is a difference from the 4 L that he required at last office visit.  Patient also completed pulmonary function testing today.  See the pulmonary function testing results listed below:  02/10/2020-pulmonary function test-FVC 2.23 (62% predicted), postbronchodilator ratio 43, postbronchodilator FEV1 1 (38% predicted), no bronchodilator response, DLCO 7.38 (30% predicted) >>>did use Stiolto Respimat before PFT  Patient reporting no family history of lung disease.  He reports that his father is a light smoker but was diagnosed with COPD he believes.  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  So likely increased air pollution and smog exposure.  Questionaires / Pulmonary Flowsheets:   MMRC: mMRC Dyspnea Scale mMRC Score  02/10/2020 2    Tests:   01/26/2020-CBC with differential-eosinophils relative 2, eosinophils absolute 0.1  02/10/2020-pulmonary function test-FVC 2.23 (62%  predicted), postbronchodilator ratio 43, postbronchodilator FEV1 1 L (38% predicted), no bronchodilator response, DLCO 7.38 (30% predicted)  FENO:  No results found for: NITRICOXIDE  PFT: PFT Results Latest Ref Rng & Units 02/10/2020  FVC-Pre L 2.23  FVC-Predicted Pre % 62  FVC-Post L 2.32  FVC-Predicted Post % 65  Pre FEV1/FVC % % 44  Post FEV1/FCV % % 43  FEV1-Pre L 0.98  FEV1-Predicted Pre % 37  FEV1-Post L 1.00  DLCO UNC% % 30  DLCO COR %Predicted % 48  TLC L 6.15  TLC % Predicted % 89  RV % Predicted % 134    WALK:  No flowsheet data found.  Imaging: No results found.  Lab Results:  CBC    Component Value Date/Time   WBC 3.7 01/26/2020 1127   RBC 4.48 01/26/2020 1127   HGB 14.3 01/26/2020 1127   HCT 42.6 01/26/2020 1127   PLT 166 01/26/2020 1127   MCV 95 01/26/2020 1127   MCH 31.9 01/26/2020 1127   MCHC 33.6 01/26/2020 1127   RDW 13.8 01/26/2020 1127   LYMPHSABS 1.7 01/26/2020 1127   EOSABS 0.1 01/26/2020 1127   BASOSABS 0.0 01/26/2020 1127    BMET    Component Value Date/Time   NA 144 01/26/2020 1127   K 4.6 01/26/2020 1127   CL 105 01/26/2020 1127   CO2 24 01/26/2020 1127   GLUCOSE 104 (H) 01/26/2020 1127   BUN 17 01/26/2020 1127   CREATININE 1.37 (H) 01/26/2020 1127   CALCIUM 10.2 01/26/2020 1127   GFRNONAA 49 (L) 01/26/2020 1127   GFRAA 57 (L) 01/26/2020 1127    BNP No results found for:  BNP  ProBNP No results found for: PROBNP  Specialty Problems      Pulmonary Problems   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  Counseled patient today and the importance of immunizations.  Instructed patient that I would like for him to reconsider her stance on the pneumonia vaccine, flu vaccine and COVID-19 vaccine.  We recommend the patient receive Pneumovax 23, COVID-19 vaccine as well as the flu vaccine seasonally.  Past Medical History:  Diagnosis Date  . 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 Gout   . Hypertension   . Hypertension     Tobacco History: Social History   Tobacco Use  Smoking Status Former Smoker  . Packs/day: 1.00  . Years: 27.00  . Pack years: 27.00  . Types: Cigarettes  . Start date: 12/01/1956  . Quit date: 12/01/1993  . Years since quitting: 26.2  Smokeless Tobacco Never Used   Counseling given: Yes   Continue to not smoke  Outpatient Encounter Medications as of 02/10/2020  Medication Sig  . albuterol (VENTOLIN HFA) 108 (90 Base) MCG/ACT inhaler Inhale into the lungs every 6 (six) hours as needed for wheezing or shortness of breath.  Marland Kitchen atorvastatin (LIPITOR) 40 MG tablet TAKE 1 TABLET BY MOUTH ONCE DAILY  . bicalutamide (CASODEX) 50 MG tablet   . oxybutynin (DITROPAN-XL) 5 MG 24 hr tablet   . OXYGEN Inhale 2 L into the lungs.  . sertraline (ZOLOFT) 50 MG tablet Take 50 mg by mouth daily.  . tamsulosin (FLOMAX) 0.4 MG CAPS capsule TAKE 1 CAPSULE BY MOUTH ONCE DAILY 1/2 HOUR BEFORE THE SAME MEAL EACH DAY.  Marland Kitchen Tiotropium Bromide Monohydrate (SPIRIVA RESPIMAT) 2.5 MCG/ACT AERS   . valsartan (DIOVAN) 80 MG tablet   . XTANDI 40 MG capsule Take 160 mg by mouth daily.   No facility-administered encounter medications on file as of 02/10/2020.     Review of Systems  Review of Systems  Constitutional: Negative for activity change, chills, fatigue, fever and unexpected weight change.  HENT: Negative for postnasal drip, rhinorrhea, sinus pressure, sinus pain and sore throat.   Eyes: Negative.   Respiratory: Positive for shortness of  breath. Negative for cough and wheezing.   Cardiovascular: Negative for chest pain and palpitations.  Gastrointestinal: Negative for constipation, diarrhea, nausea and vomiting.  Endocrine: Negative.   Genitourinary: Negative.   Musculoskeletal: Negative.   Skin: Negative.   Neurological: Negative for dizziness and headaches.  Psychiatric/Behavioral: Negative.  Negative for dysphoric mood. The patient is not nervous/anxious.   All other systems reviewed and are negative.    Physical Exam  BP 128/80 (BP Location: Left Arm, Patient Position: Sitting, Cuff Size: Normal)   Pulse 90   Temp (!) 97.5 F (36.4 C) (Temporal)   Ht 5\' 9"  (1.753 m)   Wt 172 lb 9.6 oz (78.3 kg)   SpO2 93% Comment: on RA  BMI 25.49 kg/m   Wt Readings from Last 5 Encounters:  02/10/20 172 lb 9.6 oz (78.3 kg)  01/26/20 174 lb 6.4 oz (79.1 kg)  12/15/19  170 lb (77.1 kg)    BMI Readings from Last 5 Encounters:  02/10/20 25.49 kg/m  01/26/20 26.52 kg/m  12/15/19 25.85 kg/m     Physical Exam Vitals and nursing note reviewed.  Constitutional:      General: He is not in acute distress.    Appearance: Normal appearance. He is normal weight.  HENT:     Head: Normocephalic and atraumatic.     Right Ear: Hearing, tympanic membrane, ear canal and external ear normal. There is no impacted cerumen.     Left Ear: Hearing, tympanic membrane, ear canal and external ear normal. There is no impacted cerumen.     Nose: Rhinorrhea present. No mucosal edema.     Right Turbinates: Not enlarged.     Left Turbinates: Not enlarged.     Mouth/Throat:     Mouth: Mucous membranes are dry.     Pharynx: Oropharynx is clear. No oropharyngeal exudate.  Eyes:     Pupils: Pupils are equal, round, and reactive to light.  Cardiovascular:     Rate and Rhythm: Normal rate and regular rhythm.     Pulses: Normal pulses.     Heart sounds: Normal heart sounds. No murmur.  Pulmonary:     Effort: Pulmonary effort is normal.      Breath sounds: No decreased breath sounds, wheezing or rales.  Abdominal:     General: Abdomen is flat.     Palpations: Abdomen is soft.  Musculoskeletal:     Cervical back: Normal range of motion.     Right lower leg: No edema.     Left lower leg: No edema.  Lymphadenopathy:     Cervical: No cervical adenopathy.  Skin:    General: Skin is warm and dry.     Capillary Refill: Capillary refill takes less than 2 seconds.     Findings: No erythema or rash.  Neurological:     General: No focal deficit present.     Mental Status: He is alert and oriented to person, place, and time.     Motor: No weakness.     Coordination: Coordination normal.     Gait: Gait is intact. Gait normal.  Psychiatric:        Mood and Affect: Mood normal.        Behavior: Behavior normal. Behavior is cooperative.        Thought Content: Thought content normal.        Judgment: Judgment normal.       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[place]) Reviewed pulmonary function testing with patient No significant eosinophilia on lab work No alpha-1 on file Walk today in office, patient did not have any oxygen desaturations  Plan: Continue Stiolto Respimat Emphasized importance that patient reconsider stance on immunizations that we have recommended: Pneumovax 23, seasonal flu vaccine, COVID-19 4-week follow-up with our office with a walk Lab work today Chest x-ray today ONO ordered on [place] maintenance Patient has not received the seasonal flu vaccine, Pneumovax 23, or COVID-19 vaccine  Discussion: Explained to patient the importance of having preventative health care in order to prevent hospitalizations as well as complications if he was exposed to the flu, Covid or developed a bacterial pneumonia infection.  Reviewed this with patient's daughter who is also in the room today.  They report that they will discuss this amongst each other as well as plan on following up with  our office at the next follow-up to hopefully  receive the immunizations  Plan: We recommend the Pneumovax 23 Seasonal flu vaccine as well as the COVID-19 vaccine Follow-up in 4 weeks    Return in about 6 weeks (around 03/23/2020), or if symptoms worsen or fail to improve, for Follow up with Dr. Carlis Abbott, Follow up with Wyn Quaker FNP-C.   Lauraine Rinne, NP 02/10/2020   This appointment required 45 minutes of patient care (this includes precharting, chart review, review of results, face-to-face care, etc.).

## 2020-02-10 ENCOUNTER — Ambulatory Visit (INDEPENDENT_AMBULATORY_CARE_PROVIDER_SITE_OTHER): Payer: PPO

## 2020-02-10 ENCOUNTER — Ambulatory Visit (INDEPENDENT_AMBULATORY_CARE_PROVIDER_SITE_OTHER): Payer: PPO | Admitting: Pulmonary Disease

## 2020-02-10 ENCOUNTER — Encounter: Payer: Self-pay | Admitting: Pulmonary Disease

## 2020-02-10 ENCOUNTER — Other Ambulatory Visit: Payer: Self-pay

## 2020-02-10 ENCOUNTER — Ambulatory Visit (INDEPENDENT_AMBULATORY_CARE_PROVIDER_SITE_OTHER): Payer: PPO | Admitting: Critical Care Medicine

## 2020-02-10 VITALS — BP 128/80 | HR 90 | Temp 97.5°F | Ht 69.0 in | Wt 172.6 lb

## 2020-02-10 DIAGNOSIS — J449 Chronic obstructive pulmonary disease, unspecified: Secondary | ICD-10-CM

## 2020-02-10 DIAGNOSIS — R05 Cough: Secondary | ICD-10-CM | POA: Diagnosis not present

## 2020-02-10 DIAGNOSIS — Z0000 Encounter for general adult medical examination without abnormal findings: Secondary | ICD-10-CM | POA: Diagnosis not present

## 2020-02-10 DIAGNOSIS — R0609 Other forms of dyspnea: Secondary | ICD-10-CM

## 2020-02-10 DIAGNOSIS — R0600 Dyspnea, unspecified: Secondary | ICD-10-CM

## 2020-02-10 DIAGNOSIS — R062 Wheezing: Secondary | ICD-10-CM

## 2020-02-10 LAB — PULMONARY FUNCTION TEST
DL/VA % pred: 48 %
DL/VA: 1.92 ml/min/mmHg/L
DLCO cor % pred: 30 %
DLCO cor: 7.44 ml/min/mmHg
DLCO unc % pred: 30 %
DLCO unc: 7.38 ml/min/mmHg
FEF 25-75 Post: 0.4 L/sec
FEF 25-75 Pre: 0.4 L/sec
FEF2575-%Change-Post: 1 %
FEF2575-%Pred-Post: 19 %
FEF2575-%Pred-Pre: 18 %
FEV1-%Change-Post: 1 %
FEV1-%Pred-Post: 38 %
FEV1-%Pred-Pre: 37 %
FEV1-Post: 1 L
FEV1-Pre: 0.98 L
FEV1FVC-%Change-Post: -2 %
FEV1FVC-%Pred-Pre: 58 %
FEV6-%Change-Post: 1 %
FEV6-%Pred-Post: 64 %
FEV6-%Pred-Pre: 63 %
FEV6-Post: 2.17 L
FEV6-Pre: 2.15 L
FEV6FVC-%Change-Post: -2 %
FEV6FVC-%Pred-Post: 99 %
FEV6FVC-%Pred-Pre: 102 %
FVC-%Change-Post: 4 %
FVC-%Pred-Post: 65 %
FVC-%Pred-Pre: 62 %
FVC-Post: 2.32 L
FVC-Pre: 2.23 L
Post FEV1/FVC ratio: 43 %
Post FEV6/FVC ratio: 94 %
Pre FEV1/FVC ratio: 44 %
Pre FEV6/FVC Ratio: 97 %
RV % pred: 134 %
RV: 3.4 L
TLC % pred: 89 %
TLC: 6.15 L

## 2020-02-10 MED ORDER — STIOLTO RESPIMAT 2.5-2.5 MCG/ACT IN AERS: 2.0000 | INHALATION_SPRAY | Freq: Every day | RESPIRATORY_TRACT | 0 refills | Status: DC

## 2020-02-10 NOTE — Addendum Note (Signed)
Addended by: Valerie Salts on: 02/10/2020 03:51 PM   Modules accepted: Orders

## 2020-02-10 NOTE — Addendum Note (Signed)
Addended by: Lauraine Rinne on: 02/10/2020 03:28 PM   Modules accepted: Orders

## 2020-02-10 NOTE — Assessment & Plan Note (Signed)
Patient has not received the seasonal flu vaccine, Pneumovax 23, or COVID-19 vaccine  Discussion: Explained to patient the importance of having preventative health care in order to prevent hospitalizations as well as complications if he was exposed to the flu, Covid or developed a bacterial pneumonia infection.  Reviewed this with patient's daughter who is also in the room today.  They report that they will discuss this amongst each other as well as plan on following up with our office at the next follow-up to hopefully receive the immunizations  Plan: We recommend the Pneumovax 23 Seasonal flu vaccine as well as the COVID-19 vaccine Follow-up in 4 weeks

## 2020-02-10 NOTE — Patient Instructions (Addendum)
You were seen today by Lauraine Rinne, NP  for:   Thank you for coming in.  We reviewed your breathing test today.  Please remain on Stiolto Respimat.  We also walked in office to assess your oxygen levels.  We will do lab work as well as a chest x-ray today.  Based off your chest x-ray we may consider a CT of your chest.  I would like for you to consider getting the pneumonia vaccines if you have not received them please check with primary care.  We would recommend the Covid shot as well as the flu vaccine next flu season.  Nice meeting you,   Aaron Edelman   1. Chronic obstructive pulmonary disease, unspecified COPD type ([place])  - Alpha-1 antitrypsin phenotype; Future - DG Chest 2 View; Future  Stiolto Respimat inhaler >>>2 puffs daily >>>Take this no matter what >>>This is not a rescue inhaler  Note your daily symptoms > remember "red flags" for COPD:   >>>Increase in cough >>>increase in sputum production >>>increase in shortness of breath or activity  intolerance.   If you notice these symptoms, please call the office to be seen.   You did not need oxygen today on your walk   2. Healthcare maintenance  We would recommend the pneumonia vaccine-Pneumovax 23   COVID-19 Vaccine Information can be found at: ShippingScam.co.uk For questions related to vaccine distribution or appointments, please email vaccine@[email or web address] or call [phone].      We recommend today:  Orders Placed This Encounter  Procedures  . DG Chest 2 View    Standing Status:   Future    Standing Expiration Date:   04/11/2021    Order Specific Question:   Reason for Exam (SYMPTOM  OR DIAGNOSIS REQUIRED)    Answer:   cough/copd    Order Specific Question:   Preferred imaging location?    Answer:   Internal    Order Specific Question:   Radiology Contrast Protocol - do NOT remove file path    Answer:    [email or web address]  . Alpha-1 antitrypsin phenotype    Standing Status:   Future    Standing Expiration Date:   02/09/2021   Orders Placed This Encounter  Procedures  . DG Chest 2 View  . Alpha-1 antitrypsin phenotype   No orders of the defined types were placed in this encounter.   Follow Up:    Return in about 6 weeks (around 03/23/2020), or if symptoms worsen or fail to improve, for Follow up with Dr. Carlis Abbott, Follow up with Wyn Quaker FNP-C.   Please do your part to reduce the spread of COVID-19:      Reduce your risk of any infection  and COVID19 by using the similar precautions used for avoiding the common cold or flu:  Marland Kitchen Wash your hands often with soap and warm water for at least 20 seconds.  If soap and water are not readily available, use an alcohol-based hand sanitizer with at least 60% alcohol.  . If coughing or sneezing, cover your mouth and nose by coughing or sneezing into the elbow areas of your shirt or coat, into a tissue or into your sleeve (not your hands). Langley Gauss A MASK when in public  . Avoid shaking hands with others and consider head nods or verbal greetings only. . Avoid touching your eyes, nose, or mouth with unwashed hands.  . Avoid close contact with people who are sick. . Avoid places or events with large numbers of people  in one location, like concerts or sporting events. . If you have some symptoms but not all symptoms, continue to monitor at home and seek medical attention if your symptoms worsen. . If you are having a medical emergency, call 911.   [place] / e-Visit: [email or web address]         [facility]: [facility]: [id number]                   [facility]: [id number]     It is flu season:   >>> Best ways to protect herself from the flu:  Receive the yearly flu vaccine, practice good hand hygiene washing with soap and also using hand sanitizer when available, eat a nutritious meals, get adequate rest, hydrate appropriately   Please contact the office if your symptoms worsen or you have concerns that you are not improving.   Thank you for choosing [facility] for your healthcare, and for allowing [place] to partner with you on your healthcare journey. I am thankful to be able to provide care to you today.   Wyn Quaker FNP-C     COPD and Physical Activity Chronic obstructive pulmonary disease (COPD) is a long-term (chronic) condition that affects the lungs. COPD is a general term that can be used to describe many different lung problems that cause lung swelling (inflammation) and limit airflow, including chronic bronchitis and emphysema. The main symptom of COPD is shortness of breath, which makes it harder to do even simple tasks. This can also make it harder to exercise and be active. Talk with your health care provider about treatments to help you breathe better and actions you can take to prevent breathing problems during physical activity. What are the benefits of exercising with COPD? Exercising regularly is an important part of a healthy lifestyle. You can still exercise and do physical activities even though you have COPD. Exercise and physical activity improve your shortness of breath by increasing blood flow (circulation). This causes your heart to pump more oxygen through your body. Moderate exercise can improve your:  Oxygen use.  Energy level.  Shortness of breath.  Strength in your breathing muscles.  Heart health.  Sleep.  Self-esteem and feelings of self-worth.  Depression, stress, and anxiety levels. Exercise can benefit everyone with COPD. The severity of your disease may affect how hard you can exercise, especially at first, but everyone can benefit. Talk with your health care provider about how  much exercise is safe for you, and which activities and exercises are safe for you. What actions can I take to prevent breathing problems during physical activity?  Sign up for a pulmonary rehabilitation program. This type of program may include: ? Education about lung diseases. ? Exercise classes that teach you how to exercise and be more active while improving your breathing. This usually involves:  Exercise using your lower extremities, such as a stationary bicycle.  About 30 minutes of exercise, 2 to 5 times per week, for 6 to 12 weeks  Strength training, such as push ups or leg lifts. ? Nutrition education. ? Group classes in which you can talk with others who also have COPD and learn ways to manage stress.  If you use an oxygen tank, you should use it while you exercise. Work with your health care provider to adjust your oxygen for your physical activity. Your resting flow rate is different from your  flow rate during physical activity.  While you are exercising: ? Take slow breaths. ? Pace yourself and do not try to go too fast. ? Purse your lips while breathing out. Pursing your lips is similar to a kissing or whistling position. ? If doing exercise that uses a quick burst of effort, such as weight lifting:  Breathe in before starting the exercise.  Breathe out during the hardest part of the exercise (such as raising the weights). Where to find support You can find support for exercising with COPD from:  Your health care provider.  A pulmonary rehabilitation program.  Your local health department or community health programs.  Support groups, online or in-person. Your health care provider may be able to recommend support groups. Where to find more information You can find more information about exercising with COPD from:  American Lung Association: ClassInsider.se.  COPD Foundation: https://www.rivera.net/. Contact a health care provider if:  Your symptoms get worse.  You  have chest pain.  You have nausea.  You have a fever.  You have trouble talking or catching your breath.  You want to start a new exercise program or a new activity. Summary  COPD is a general term that can be used to describe many different lung problems that cause lung swelling (inflammation) and limit airflow. This includes chronic bronchitis and emphysema.  Exercise and physical activity improve your shortness of breath by increasing blood flow (circulation). This causes your heart to provide more oxygen to your body.  Contact your health care provider before starting any exercise program or new activity. Ask your health care provider what exercises and activities are safe for you. This information is not intended to replace advice given to you by your health care provider. Make sure you discuss any questions you have with your health care provider. Document Revised: 03/09/2019 Document Reviewed: 12/10/2017 Elsevier Patient Education  2020 [employer].

## 2020-02-10 NOTE — Progress Notes (Signed)
PFT done today. 

## 2020-02-10 NOTE — Assessment & Plan Note (Addendum)
Reviewed pulmonary function testing with patient No significant eosinophilia on lab work No alpha-1 on file Walk today in office, patient did not have any oxygen desaturations  Plan: Continue Stiolto Respimat Emphasized importance that patient reconsider stance on immunizations that we have recommended: Pneumovax 23, seasonal flu vaccine, COVID-19 4-week follow-up with our office with a walk Lab work today Chest x-ray today ONO ordered on RA

## 2020-02-13 NOTE — Progress Notes (Signed)
Agree.  Julian Hy, DO 02/13/20 8:02 PM [facility]

## 2020-02-14 ENCOUNTER — Telehealth: Payer: Self-pay | Admitting: Pulmonary Disease

## 2020-02-14 DIAGNOSIS — R918 Other nonspecific abnormal finding of lung field: Secondary | ICD-10-CM

## 2020-02-14 DIAGNOSIS — R0602 Shortness of breath: Secondary | ICD-10-CM

## 2020-02-14 NOTE — Telephone Encounter (Signed)
02/14/2020  Discussed case with Dr. Carlis Abbott yesterday.  We will proceed forward with high-resolution CT chest.  I have sent this is a result note to [facility].   High-resolution CT chest ordered to further evaluate scarring on patient's chest x-ray as well as shortness of breath.  Patient will need to have high-resolution CT chest completed before next office visit with Dr. Carlis Abbott.  Wyn Quaker, FNP

## 2020-02-15 ENCOUNTER — Encounter: Payer: Self-pay | Admitting: Pulmonary Disease

## 2020-02-18 DIAGNOSIS — J449 Chronic obstructive pulmonary disease, unspecified: Secondary | ICD-10-CM | POA: Diagnosis not present

## 2020-02-19 LAB — ALPHA-1 ANTITRYPSIN PHENOTYPE: A-1 Antitrypsin, Ser: 131 mg/dL (ref 83–199)

## 2020-02-19 NOTE — Progress Notes (Signed)
Alpha 1 levels are normal. There is not a genetic aspect to his copd. No new recs.   Wyn Quaker FNP

## 2020-02-20 DIAGNOSIS — C61 Malignant neoplasm of prostate: Secondary | ICD-10-CM | POA: Diagnosis not present

## 2020-02-20 DIAGNOSIS — R351 Nocturia: Secondary | ICD-10-CM | POA: Diagnosis not present

## 2020-02-20 DIAGNOSIS — Z79899 Other long term (current) drug therapy: Secondary | ICD-10-CM | POA: Diagnosis not present

## 2020-02-21 ENCOUNTER — Telehealth: Payer: Self-pay | Admitting: Pulmonary Disease

## 2020-02-21 DIAGNOSIS — J449 Chronic obstructive pulmonary disease, unspecified: Secondary | ICD-10-CM

## 2020-02-21 NOTE — Telephone Encounter (Signed)
02/21/2020  02/15/2020-overnight oximetry on room air-duration of sleep 9 hours and 36 minutes, total of 451 minutes spent below 88%, SaO2 low 73%, basal oxygen level 85.6%  Based off this information patient does need to use 2 L of O2 at night.  We will need to repeat the overnight oximetry test on 2 L.  We will route this message to [facility] for follow-up in Dr. Carlis Abbott as Juluis Rainier she is seeing the patient next.  Wyn Quaker FNP

## 2020-02-21 NOTE — Telephone Encounter (Signed)
Spoke with pt. He is aware of results. Order has been placed for repeat ONO on 2L. Nothing further was needed.

## 2020-02-21 NOTE — Telephone Encounter (Signed)
Yes, agree. Thanks Aaron Edelman!

## 2020-02-21 NOTE — Telephone Encounter (Signed)
Error Wyn Quaker FNP

## 2020-02-23 ENCOUNTER — Telehealth: Payer: Self-pay | Admitting: Legal Medicine

## 2020-02-23 NOTE — Progress Notes (Signed)
  Chronic Care Management   Outreach Note  02/23/2020 Name: Russell Marquez. MRN: [id number] DOB: Mar 17, 1941  Referred by: Lillard Anes, MD Reason for referral : No chief complaint on file.   An unsuccessful telephone outreach was attempted today. The patient was referred to the pharmacist for assistance with care management and care coordination.   Follow Up Plan:  Earney Hamburg Upstream Scheduler

## 2020-02-24 ENCOUNTER — Telehealth: Payer: Self-pay | Admitting: Legal Medicine

## 2020-02-24 NOTE — Progress Notes (Signed)
  Chronic Care Management   Note  02/24/2020 Name: Russell Marquez. MRN: [id number] DOB: 1941-01-18  Russell Marquez. is a 79 y.o. year old male who is a primary care patient of Lillard Anes, MD. I reached out to [employer]. by phone today in response to a referral sent by Mr. Russell Hailey Jr.'s PCP, Lillard Anes, MD.   Russell Marquez was given information about Chronic Care Management services today including:  1. CCM service includes personalized support from designated clinical staff supervised by his physician, including individualized plan of care and coordination with other care providers 2. 24/7 contact phone numbers for assistance for urgent and routine care needs. 3. Service will only be billed when office clinical staff spend 20 minutes or more in a month to coordinate care. 4. Only one practitioner may furnish and bill the service in a calendar month. 5. The patient may stop CCM services at any time (effective at the end of the month) by phone call to the office staff.   Patient agreed to services and verbal consent obtained.   Follow up plan:   Earney Hamburg Upstream Scheduler

## 2020-02-28 NOTE — Chronic Care Management (AMB) (Signed)
Chronic Care Management Pharmacy  Name: Russell Marquez.  MRN: [id number] DOB: 03/15/1941  Chief Complaint/ HPI  Willette Cluster.,  79 y.o. , male presents for their Initial CCM visit with the clinical pharmacist via telephone due to COVID-19 Pandemic.  PCP : Lillard Anes, MD  Their chronic conditions include: HTN, Emphysema, COPD, CKD, HLD, Tremor, depression, Enlarged prostate, and prostate cancer.   Office Visits: 01/26/2020 - Chronic dz visit. No anemia, kidney function stable, cholesterol normal, PSA WNL.   01/09/2020 - referred to eye doctor for blurred vision.  12/27/2019 -  Follow-up for major depression symptoms. No med changes.  12/13/2019 - Seen for symptoms of major depression. Started sertraline.   11/07/2019 - Chronic visit. Virtussin renewed.     Consult Visit: 02/21/2020 - Overnight sleep study determined patient needed 2L of O2 overnight.  02/14/2020 - Pulmonology ordered CT scan of lungs to evaluate scarring. Continue Stiolto respimat. Recommended Pneumovax 23, flu and COVID 19 vaccine.  12/30/2019 - Dr. Comer Locket for malignant neoplasm of prostate. 12/15/2019 - Pulmonology started Stiolto.  09/01/2019 - Malignant neoplasm   Medications: Outpatient Encounter Medications as of 02/29/2020  Medication Sig  . albuterol (VENTOLIN HFA) 108 (90 Base) MCG/ACT inhaler Inhale into the lungs every 6 (six) hours as needed for wheezing or shortness of breath.  Marland Kitchen atorvastatin (LIPITOR) 40 MG tablet TAKE 1 TABLET BY MOUTH ONCE DAILY  . oxybutynin (DITROPAN-XL) 5 MG 24 hr tablet   . OXYGEN Inhale 2 L into the lungs.  . tamsulosin (FLOMAX) 0.4 MG CAPS capsule TAKE 1 CAPSULE BY MOUTH ONCE DAILY 1/2 HOUR BEFORE THE SAME MEAL EACH DAY.  Marland Kitchen Tiotropium Bromide-Olodaterol (STIOLTO RESPIMAT) 2.5-2.5 MCG/ACT AERS Inhale 2 puffs into the lungs daily.  . valsartan (DIOVAN) 80 MG tablet   . XTANDI 40 MG capsule Take 160 mg by mouth daily.  . bicalutamide (CASODEX) 50 MG  tablet   . sertraline (ZOLOFT) 50 MG tablet Take 50 mg by mouth daily.  . Tiotropium Bromide Monohydrate (SPIRIVA RESPIMAT) 2.5 MCG/ACT AERS    No facility-administered encounter medications on file as of 02/29/2020.     Current Diagnosis/Assessment:  Goals Addressed            This Visit's Progress   . Pharmacy Care Plan       CARE PLAN ENTRY  Current Barriers:  . Chronic Disease Management support, education, and care coordination needs related to HTN, HLD, and COPD  Pharmacist Clinical Goal(s):  . Ensure safety, efficacy, and affordability of medications . Recommend maintaining BP <140/90 mmHg  . Goal Cholesterol: TC <200, LDL <100, TG <150 HDL >50 mg/dL  Interventions: . Comprehensive medication review performed. . Recommend engaging in moderate intensity exercise 5 times each week.  . Recommend following the Dash diet guidance of incorporating fruits, vegetables, whole-grains, low-fat dairy products, lean meats, legumes and nuts for optimal health. . Recommend keeping sodium intake less than 1500 mg/day.   Patient Self Care Activities:  . Patient indicates that he will set up COVID vaccine appointment and let pulmonologist know at his appointment tomorrow.  . Patient indicates that he would like to begin building up his walking.   Initial goal documentation        COPD / Asthma / Tobacco   Eosinophil count:  No results found for: EOSPCT%  Eos (Absolute):  Lab Results  Component Value Date/Time   EOSABS 0.1 01/26/2020 11:27 AM    Tobacco Status:  Social History   Tobacco Use  Smoking Status Former Smoker  . Packs/day: 1.00  . Years: 27.00  . Pack years: 27.00  . Types: Cigarettes  . Start date: 12/01/1956  . Quit date: 12/01/1993  . Years since quitting: 26.2  Smokeless Tobacco Never Used    Patient has failed these meds in past: anoro inhale, spiriva respimat,  Patient is currently controlled on the following  medications: albuterol inhaler, stiolto respimat, oxygen Using maintenance inhaler regularly? Yes Frequency of rescue inhaler use:  2-3x per week  We discussed:  Patient is seeing pulmonology to address breathing issues. He says they are still doing scans and test to determine the cause of breathing issues. Patient hopes to begin building up stamina to walk and participate in light exercise.   Plan  Continue current medications  and  Hypertension   Office blood pressures are  BP Readings from Last 3 Encounters:  02/10/20 128/80  01/26/20 130/84   Patient has failed these meds in the past: valsartan-hctz Patient is currently controlled on the following medications: Valsartan 80 mg   Patient checks BP at home weekly  Patient home BP readings are ranging: 128/80  We discussed diet and exercise extensively. Patient is working to reduce sodium in diet.   Plan  Continue current medications   Hyperlipidemia   Lipid Panel     Component Value Date/Time   CHOL 151 01/26/2020 1127   TRIG 65 01/26/2020 1127   HDL 61 01/26/2020 1127   CHOLHDL 2.5 01/26/2020 1127   LDLCALC 77 01/26/2020 1127   LABVLDL 13 01/26/2020 1127     The 10-year ASCVD risk score Mikey Bussing DC Jr., et al., 2013) is: 20.6%   Values used to calculate the score:     Age: 61 years     Sex: Male     Is Non-Hispanic African American: Yes     Diabetic: No     Tobacco smoker: No     Systolic Blood Pressure: 0000000 mmHg     Is BP treated: Yes     HDL Cholesterol: 61 mg/dL     Total Cholesterol: 151 mg/dL   Patient has failed these meds in past: n/a Patient is currently controlled on the following medications: atorvastatin 40 mg   We discussed:  diet and exercise extensively. Patient indicates that he is eating more chicken. He no longer drinks alcohol and is working to reduce red meat.  Plan  Continue current medications   Depression   Patient has failed these meds in past: zoloft Patient is currently  controlled on the following medications: n/a currently  We discussed:  Patient did not refill sertraline. He reports that crying and nervousness is much better since he is staying at home a little more. His daughter from [place] is staying with him to help him care for his sister. Patient indicates that he is feeling much better.   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 His oncologist has recently retired and is seeing a new one. He reports that medications are both affordable adn seem to be helping.   Plan  Continue current medications   Gout?   Patient has failed these meds in past: allopurinol Patient is currently controlled on the following medications: n/a  We discussed: Patient is not currently dealing with Gout. We discussed that his improved diet is also helping prevent future Gout episodes. Not currently treating preventatively.   Plan  Continue control with diet.   Vaccines   Reviewed and discussed patient's vaccination history. Patient has previously declined COVID and Pneumonia vaccine recommendation from pulmonology. We discussed risk/benefits of vaccines. He plans to let them know at his visit tomorrow that he would like to receive.   Plan  Recommended patient receive COVID vaccine.   Health Maintenance   Patient is currently controlled on the following medications:   Takes naproxen very seldom OTC to treat pain or headache.   We discussed:  diet and exercise extensively  Plan  Continue current medications   Medication Management   Pt uses [name] for all medications and uses a weekly pill planner to keep medications organized.  Pt endorses good  compliance - rarely misses doses.   We discussed: Patient's good adherence aids already in place.   Plan Patient to continue taking medications as prescribed.        Follow up: 3 months with Pharmacist

## 2020-02-29 ENCOUNTER — Ambulatory Visit: Payer: PPO

## 2020-02-29 ENCOUNTER — Other Ambulatory Visit: Payer: Self-pay

## 2020-02-29 DIAGNOSIS — E782 Mixed hyperlipidemia: Secondary | ICD-10-CM

## 2020-02-29 DIAGNOSIS — J449 Chronic obstructive pulmonary disease, unspecified: Secondary | ICD-10-CM

## 2020-02-29 DIAGNOSIS — I10 Essential (primary) hypertension: Secondary | ICD-10-CM

## 2020-02-29 NOTE — Patient Instructions (Addendum)
Visit Information  Thank you for your time discussing your medications. I look forward to working with you to achieve your health care goals. Below is a summary of what we talked about during our visit.   Goals Addressed            This Visit's Progress   . Pharmacy Care Plan       CARE PLAN ENTRY  Current Barriers:  . Chronic Disease Management support, education, and care coordination needs related to HTN, HLD, and COPD  Pharmacist Clinical Goal(s):  . Ensure safety, efficacy, and affordability of medications . Recommend maintaining BP <140/90 mmHg  . Goal Cholesterol: TC <200, LDL <100, TG <150 HDL >50 mg/dL  Interventions: . Comprehensive medication review performed. . Recommend engaging in moderate intensity exercise 5 times each week.  . Recommend following the Dash diet guidance of incorporating fruits, vegetables, whole-grains, low-fat dairy products, lean meats, legumes and nuts for optimal health. . Recommend keeping sodium intake less than 1500 mg/day.   Patient Self Care Activities:  . Patient indicates that he will set up COVID vaccine appointment and let pulmonologist know at his appointment tomorrow.  . Patient indicates that he would like to begin building up his walking.   Initial goal documentation        Russell Marquez was given information about Chronic Care Management services today including:  1. CCM service includes personalized support from designated clinical staff supervised by his physician, including individualized plan of care and coordination with other care providers 2. 24/7 contact phone numbers for assistance for urgent and routine care needs. 3. Standard insurance, coinsurance, copays and deductibles apply for chronic care management only during months in which we provide at least 20 minutes of these services. Most insurances cover these services at 100%, however patients may be responsible for any copay, coinsurance and/or deductible if  applicable. This service may help you avoid the need for more expensive face-to-face services. 4. Only one practitioner may furnish and bill the service in a calendar month. 5. The patient may stop CCM services at any time (effective at the end of the month) by phone call to the office staff.  Patient agreed to services and verbal consent obtained.   Print copy of patient instructions provided.  Telephone follow up appointment with pharmacy team member scheduled for:05/31/2020  Sherre Poot, PharmD Clinical Pharmacist [facility] [phone]  DASH Eating Plan DASH stands for "Dietary Approaches to Stop Hypertension." The DASH eating plan is a healthy eating plan that has been shown to reduce high blood pressure (hypertension). It may also reduce your risk for type 2 diabetes, heart disease, and stroke. The DASH eating plan may also help with weight loss. What are tips for following this plan?  General guidelines  Avoid eating more than 2,300 mg (milligrams) of salt (sodium) a day. If you have hypertension, you may need to reduce your sodium intake to 1,500 mg a day.  Limit alcohol intake to no more than 1 drink a day for nonpregnant women and 2 drinks a day for men. One drink equals 12 oz of beer, 5 oz of wine, or 1 oz of hard liquor.  Work with your health care provider to maintain a healthy body weight or to lose weight. Ask what an ideal weight is for you.  Get at least 30 minutes of exercise that causes your heart to beat faster (aerobic exercise) most days of the week. Activities may include walking, swimming, or biking.  Work with your health care provider or diet and nutrition specialist (dietitian) to adjust your eating plan to your individual calorie needs. Reading food labels   Check food labels for the amount of sodium per serving. Choose foods with less than 5 percent of the Daily Value of sodium. Generally, foods with less than 300 mg of sodium per serving  fit into this eating plan.  To find whole grains, look for the word "whole" as the first word in the ingredient list. Shopping  Buy products labeled as "low-sodium" or "no salt added."  Buy fresh foods. Avoid canned foods and premade or frozen meals. Cooking  Avoid adding salt when cooking. Use salt-free seasonings or herbs instead of table salt or sea salt. Check with your health care provider or pharmacist before using salt substitutes.  Do not fry foods. Cook foods using healthy methods such as baking, boiling, grilling, and broiling instead.  Cook with heart-healthy oils, such as olive, canola, soybean, or sunflower oil. Meal planning  Eat a balanced diet that includes: ? 5 or more servings of fruits and vegetables each day. At each meal, try to fill half of your plate with fruits and vegetables. ? Up to 6-8 servings of whole grains each day. ? Less than 6 oz of lean meat, poultry, or fish each day. A 3-oz serving of meat is about the same size as a deck of cards. One egg equals 1 oz. ? 2 servings of low-fat dairy each day. ? A serving of nuts, seeds, or beans 5 times each week. ? Heart-healthy fats. Healthy fats called Omega-3 fatty acids are found in foods such as flaxseeds and coldwater fish, like sardines, salmon, and mackerel.  Limit how much you eat of the following: ? Canned or prepackaged foods. ? Food that is high in trans fat, such as fried foods. ? Food that is high in saturated fat, such as fatty meat. ? Sweets, desserts, sugary drinks, and other foods with added sugar. ? Full-fat dairy products.  Do not salt foods before eating.  Try to eat at least 2 vegetarian meals each week.  Eat more home-cooked food and less restaurant, buffet, and fast food.  When eating at a restaurant, ask that your food be prepared with less salt or no salt, if possible. What foods are recommended? The items listed may not be a complete list. Talk with your dietitian about what  dietary choices are best for you. Grains Whole-grain or whole-wheat bread. Whole-grain or whole-wheat pasta. Russell Marquez rice. Russell Marquez. Bulgur. Whole-grain and low-sodium cereals. Pita bread. Low-fat, low-sodium crackers. Whole-wheat flour tortillas. Vegetables Fresh or frozen vegetables (raw, steamed, roasted, or grilled). Low-sodium or reduced-sodium tomato and vegetable juice. Low-sodium or reduced-sodium tomato sauce and tomato paste. Low-sodium or reduced-sodium canned vegetables. Fruits All fresh, dried, or frozen fruit. Canned fruit in natural juice (without added sugar). Meat and other protein foods Skinless chicken or [place]. Ground chicken or [place]. Pork with fat trimmed off. Fish and seafood. Egg whites. Dried beans, peas, or lentils. Unsalted nuts, nut butters, and seeds. Unsalted canned beans. Lean cuts of beef with fat trimmed off. Low-sodium, lean deli meat. Dairy Low-fat (1%) or fat-free (skim) milk. Fat-free, low-fat, or reduced-fat cheeses. Nonfat, low-sodium ricotta or cottage cheese. Low-fat or nonfat yogurt. Low-fat, low-sodium cheese. Fats and oils Soft margarine without trans fats. Vegetable oil. Low-fat, reduced-fat, or light mayonnaise and salad dressings (reduced-sodium). Canola, safflower, olive, soybean, and sunflower oils. Avocado. Seasoning and other foods Herbs. Spices. Seasoning mixes  without salt. Unsalted popcorn and pretzels. Fat-free sweets. What foods are not recommended? The items listed may not be a complete list. Talk with your dietitian about what dietary choices are best for you. Grains Baked goods made with fat, such as croissants, muffins, or some breads. Dry pasta or rice meal packs. Vegetables Creamed or fried vegetables. Vegetables in a cheese sauce. Regular canned vegetables (not low-sodium or reduced-sodium). Regular canned tomato sauce and paste (not low-sodium or reduced-sodium). Regular tomato and vegetable juice (not low-sodium or  reduced-sodium). Angie Fava. Olives. Fruits Canned fruit in a light or heavy syrup. Fried fruit. Fruit in cream or butter sauce. Meat and other protein foods Fatty cuts of meat. Ribs. Fried meat. Berniece Salines. Sausage. Bologna and other processed lunch meats. Salami. Fatback. Hotdogs. Bratwurst. Salted nuts and seeds. Canned beans with added salt. Canned or smoked fish. Whole eggs or egg yolks. Chicken or [place] with skin. Dairy Whole or 2% milk, cream, and half-and-half. Whole or full-fat cream cheese. Whole-fat or sweetened yogurt. Full-fat cheese. Nondairy creamers. Whipped toppings. Processed cheese and cheese spreads. Fats and oils Butter. Stick margarine. Lard. Shortening. Ghee. Bacon fat. Tropical oils, such as coconut, palm kernel, or palm oil. Seasoning and other foods Salted popcorn and pretzels. Onion salt, garlic salt, seasoned salt, table salt, and sea salt. Worcestershire sauce. Tartar sauce. Barbecue sauce. Teriyaki sauce. Soy sauce, including reduced-sodium. Steak sauce. Canned and packaged gravies. Fish sauce. Oyster sauce. Cocktail sauce. Horseradish that you find on the shelf. Ketchup. Mustard. Meat flavorings and tenderizers. Bouillon cubes. Hot sauce and Tabasco sauce. Premade or packaged marinades. Premade or packaged taco seasonings. Relishes. Regular salad dressings. Where to find more information:  National Heart, Lung, and [place]: https://wilson-eaton.com/  American Heart Association: www.heart.org Summary  The DASH eating plan is a healthy eating plan that has been shown to reduce high blood pressure (hypertension). It may also reduce your risk for type 2 diabetes, heart disease, and stroke.  With the DASH eating plan, you should limit salt (sodium) intake to 2,300 mg a day. If you have hypertension, you may need to reduce your sodium intake to 1,500 mg a day.  When on the DASH eating plan, aim to eat more fresh fruits and vegetables, whole grains, lean proteins, low-fat  dairy, and heart-healthy fats.  Work with your health care provider or diet and nutrition specialist (dietitian) to adjust your eating plan to your individual calorie needs. This information is not intended to replace advice given to you by your health care provider. Make sure you discuss any questions you have with your health care provider. Document Revised: 10/30/2017 Document Reviewed: 11/10/2016 Elsevier Patient Education  2020 [employer].

## 2020-03-01 ENCOUNTER — Ambulatory Visit (INDEPENDENT_AMBULATORY_CARE_PROVIDER_SITE_OTHER)
Admission: RE | Admit: 2020-03-01 | Discharge: 2020-03-01 | Disposition: A | Discharge status: Home / Self Care | Payer: PPO | Source: Ambulatory Visit | Attending: Pulmonary Disease | Admitting: Pulmonary Disease

## 2020-03-01 ENCOUNTER — Other Ambulatory Visit: Payer: Self-pay

## 2020-03-01 DIAGNOSIS — R0602 Shortness of breath: Secondary | ICD-10-CM

## 2020-03-01 DIAGNOSIS — R918 Other nonspecific abnormal finding of lung field: Secondary | ICD-10-CM

## 2020-03-06 ENCOUNTER — Other Ambulatory Visit: Payer: Self-pay

## 2020-03-06 ENCOUNTER — Telehealth: Payer: Self-pay | Admitting: Pulmonary Disease

## 2020-03-06 DIAGNOSIS — E782 Mixed hyperlipidemia: Secondary | ICD-10-CM

## 2020-03-06 DIAGNOSIS — I10 Essential (primary) hypertension: Secondary | ICD-10-CM

## 2020-03-06 NOTE — Telephone Encounter (Signed)
Lauraine Rinne, NP  03/05/2020 2:21 PM EDT    High-resolution CT chest showing severe emphysema. Also showing bibasilar scarring. No evidence of interstitial lung disease. This is good news.  No new recommendations. Keep follow-up with Dr. Carlis Abbott this month for further review.  Wyn Quaker, FNP  ---------------------------------------------------------------------------- Spoke with pt. He is aware of results. Nothing further was needed.

## 2020-03-06 NOTE — Progress Notes (Signed)
CCM appointment was on 02/29/2020 with Sara Beth Brown, PharmD.  

## 2020-03-13 DIAGNOSIS — J449 Chronic obstructive pulmonary disease, unspecified: Secondary | ICD-10-CM | POA: Diagnosis not present

## 2020-03-14 ENCOUNTER — Telehealth: Payer: Self-pay | Admitting: Pulmonary Disease

## 2020-03-14 DIAGNOSIS — Z9189 Other specified personal risk factors, not elsewhere classified: Secondary | ICD-10-CM

## 2020-03-14 NOTE — Telephone Encounter (Signed)
03/14/2020  Patient has completed a second overnight oximetry.  First overnight oximetry results are listed below:  02/15/2020-overnight oximetry on room air-duration of sleep 9 hours and 36 minutes, total of 451 minutes spent below 88%, SaO2 low 73%, basal oxygen level 85.6%  Patient then was instructed to start on 2 L of O2 and to have another overnight oximetry completed.  Those results are listed below:  03/07/2020-overnight oximetry on 2 L-duration 8 hours and 30 minutes, time spent below 88% 132.9 minutes, SaO2 low 69%  Patient had multiple oxygen desaturation events 25 events an hour.  It suspected that based off these results patient may benefit from a sleep eval.  Please have the patient start using 4 L of O2 at night.  We will also order a split-night sleep study to be completed in the sleep lab to further evaluate for obstructive sleep apnea.  It needs to be done in the sleep lab due to the patient needing oxygen at night.  If the patient has additional questions or concerns regarding this or is hesitant to complete a sleep study.  Please schedule with sleep MD for consult for further evaluation as Dr. Carlis Abbott does not manage sleep.  Wyn Quaker FNP

## 2020-03-16 NOTE — Telephone Encounter (Signed)
Left message for patient to call back. Patient has an appt with Dr. Carlis Abbott on Monday at 930am.

## 2020-03-19 ENCOUNTER — Other Ambulatory Visit: Payer: Self-pay

## 2020-03-19 ENCOUNTER — Ambulatory Visit: Payer: PPO | Admitting: Critical Care Medicine

## 2020-03-19 ENCOUNTER — Encounter: Payer: Self-pay | Admitting: Critical Care Medicine

## 2020-03-19 VITALS — BP 130/76 | HR 81 | Temp 97.3°F | Ht 68.0 in | Wt 171.4 lb

## 2020-03-19 DIAGNOSIS — J9611 Chronic respiratory failure with hypoxia: Secondary | ICD-10-CM | POA: Diagnosis not present

## 2020-03-19 DIAGNOSIS — J431 Panlobular emphysema: Secondary | ICD-10-CM

## 2020-03-19 DIAGNOSIS — J449 Chronic obstructive pulmonary disease, unspecified: Secondary | ICD-10-CM | POA: Diagnosis not present

## 2020-03-19 MED ORDER — ALBUTEROL SULFATE HFA 108 (90 BASE) MCG/ACT IN AERS: 2.0000 | INHALATION_SPRAY | RESPIRATORY_TRACT | 11 refills | Status: AC | PRN

## 2020-03-19 MED ORDER — STIOLTO RESPIMAT 2.5-2.5 MCG/ACT IN AERS: 2.0000 | INHALATION_SPRAY | Freq: Every day | RESPIRATORY_TRACT | 3 refills | Status: DC

## 2020-03-19 NOTE — Patient Instructions (Addendum)
Thank you for visiting Dr. Carlis Abbott at [facility]. We recommend the following:   Meds ordered this encounter  Medications  . Tiotropium Bromide-Olodaterol (STIOLTO RESPIMAT) 2.5-2.5 MCG/ACT AERS    Sig: Inhale 2 puffs into the lungs daily.    Dispense:  12 g    Refill:  3    Order Specific Question:   Lot Number?    Answer:   [id number]    Order Specific Question:   Expiration Date?    Answer:   11/30/2021  . albuterol (VENTOLIN HFA) 108 (90 Base) MCG/ACT inhaler    Sig: Inhale 2 puffs into the lungs every 4 (four) hours as needed for wheezing or shortness of breath.    Dispense:  18 g    Refill:  11    Return in about 3 months (around 06/18/2020).    Please do your part to reduce the spread of COVID-19.

## 2020-03-19 NOTE — Progress Notes (Signed)
Synopsis: Referred in January 2021 for COPD by Lillard Anes,*.  Subjective:   PATIENT ID: Russell Marquez. GENDER: male DOB: 11-Jun-1941, MRN: [id number]  Chief Complaint  Patient presents with  . Follow-up    Patient is feeling good since last visit. Patient is 2 liters oxygen all the time.  Patient has questions about sleep study.    Russell Marquez is a 79 year old gentleman with a history of COPD and chronic hypoxic respiratory failure who presents for follow-up.  He is accompanied today by his daughter Russell Marquez.  His breathing is significantly improved on Stiolto once daily.  Prescribed 4 L supplemental oxygen in January, but at his follow-up appointment in March with Russell Quaker, NP his saturations are okay on room air.  He has had 2 overnight oximetry studies done where he required supplemental oxygen, but needs a PSG for further evaluation.  At home his oxygen saturations at rest are "good", but he has seen them drop as low as 85% on room air when he is walking around.  He has had no worsening of his symptoms with weather changes or pollen/allergens.  Stress is the biggest trigger for him making his breathing worse.  He uses his rescue inhaler about once per week.  He and his daughter have discussed the recommended vaccines.  He is planning on having his first pneumonia vaccine next week with his PCP.  He is concerned about the Covid vaccine still.     OV 12/15/19: Russell Marquez is a 79 year old gentleman who presents for evaluation of dyspnea on exertion.  He has previously been diagnosed clinically by his primary care physician with COPD.  He has tried multiple inhalers in the past without perceived benefit.  He thinks albuterol works better than long-acting inhalers, but uses it about once per day.  He has severe dyspnea with exertion limiting his activity.  He has shortness of breath even with talking.  He is wheezing, but denies coughing or sputum production.  He quit smoking about 13  years ago after about 35 years x 0.75 ppd.  He never had dyspnea until a few years ago.  He denies significant allergy symptoms, heartburn, chest pain, or leg edema.  He has been wearing a mask and generally staying home due to Covid.  He has had pneumonia shots in the past with his primary care provider, but has not had his seasonal flu shot or Covid vaccine.  Days worried about the Covid vaccine.  His father had COPD.  He has multiple siblings who have died last few years and a sister who is in poor health.  He has had insomnia, anxiety, and frequent tearfulness.  He was recently started on Zoloft.  Although he is trying to help take care of his sister and facilitate her remaining home, he knows that he is limited due to his symptoms.   Past Medical History:  Diagnosis Date  . Cancer of prostate ([place])   . Emphysema of lung ([place])   . Gout   . Hypertension   . Hypertension      Family History  Problem Relation Age of Onset  . Heart disease Mother   . COPD Father   . HIV Brother      Past Surgical History:  Procedure Laterality Date  . EYE SURGERY      Social History   Socioeconomic History  . Marital status: Widowed    Spouse name: Not on file  . Number of children: Not on  file  . Years of education: Not on file  . Highest education level: Not on file  Occupational History  . Not on file  Tobacco Use  . Smoking status: Former Smoker    Packs/day: 1.00    Years: 27.00    Pack years: 27.00    Types: Cigarettes    Start date: 12/01/1956    Quit date: 12/01/1993    Years since quitting: 26.3  . Smokeless tobacco: Never Used  Substance and Sexual Activity  . Alcohol use: Not on file  . Drug use: Not on file  . Sexual activity: Not on file  Other Topics Concern  . Not on file  Social History Narrative  . Not on file   Social Determinants of Health   Financial Resource Strain: Low Risk   . Difficulty of Paying Living Expenses: Not very hard  Food Insecurity:   . Worried  About Charity fundraiser in the Last Year:   . Arboriculturist in the Last Year:   Transportation Needs:   . Film/video editor (Medical):   Marland Kitchen Lack of Transportation (Non-Medical):   Physical Activity: Inactive  . Days of Exercise per Week: 0 days  . Minutes of Exercise per Session: 0 min  Stress: Stress Concern Present  . Feeling of Stress : To some extent  Social Connections:   . Frequency of Communication with Friends and Family:   . Frequency of Social Gatherings with Friends and Family:   . Attends Religious Services:   . Active Member of Clubs or Organizations:   . Attends Archivist Meetings:   Marland Kitchen Marital Status:   Intimate Partner Violence:   . Fear of Current or Ex-Partner:   . Emotionally Abused:   Marland Kitchen Physically Abused:   . Sexually Abused:      No Known Allergies    There is no immunization history on file for this patient.  Outpatient Medications Prior to Visit  Medication Sig Dispense Refill  . albuterol (VENTOLIN HFA) 108 (90 Base) MCG/ACT inhaler Inhale into the lungs every 6 (six) hours as needed for wheezing or shortness of breath.    Marland Kitchen atorvastatin (LIPITOR) 40 MG tablet TAKE 1 TABLET BY MOUTH ONCE DAILY 90 tablet 1  . bicalutamide (CASODEX) 50 MG tablet     . oxybutynin (DITROPAN-XL) 5 MG 24 hr tablet     . OXYGEN Inhale 2 L into the lungs.    . sertraline (ZOLOFT) 50 MG tablet Take 50 mg by mouth daily.    . tamsulosin (FLOMAX) 0.4 MG CAPS capsule TAKE 1 CAPSULE BY MOUTH ONCE DAILY 1/2 HOUR BEFORE THE SAME MEAL EACH DAY. 90 capsule 1  . Tiotropium Bromide Monohydrate (SPIRIVA RESPIMAT) 2.5 MCG/ACT AERS     . Tiotropium Bromide-Olodaterol (STIOLTO RESPIMAT) 2.5-2.5 MCG/ACT AERS Inhale 2 puffs into the lungs daily. 4 g 0  . valsartan (DIOVAN) 80 MG tablet     . XTANDI 40 MG capsule Take 160 mg by mouth daily.     No facility-administered medications prior to visit.    Review of Systems  Respiratory: Positive for shortness of breath and  wheezing. Negative for cough.   Cardiovascular: Negative for chest pain and leg swelling.  Genitourinary: Positive for frequency.       Nocturia  Endo/Heme/Allergies: Negative for environmental allergies.  Psychiatric/Behavioral: Positive for depression. The patient is nervous/anxious and has insomnia.      Objective:   Vitals:   03/19/20 0943  BP:  130/76  Pulse: 81  Temp: (!) 97.3 F (36.3 C)  TempSrc: Temporal  SpO2: 98%  Weight: 171 lb 6.4 oz (77.7 kg)  Height: 5\' 8"  (1.727 m)   98% on  2L     BMI Readings from Last 3 Encounters:  03/19/20 26.06 kg/m  02/10/20 25.49 kg/m  01/26/20 26.52 kg/m   Wt Readings from Last 3 Encounters:  03/19/20 171 lb 6.4 oz (77.7 kg)  02/10/20 172 lb 9.6 oz (78.3 kg)  01/26/20 174 lb 6.4 oz (79.1 kg)    Physical Exam Vitals reviewed.  Constitutional:      Appearance: Normal appearance. He is not ill-appearing.  HENT:     Head: Normocephalic and atraumatic.  Eyes:     General: No scleral icterus. Cardiovascular:     Rate and Rhythm: Regular rhythm.  Pulmonary:     Comments: Breathing comfortably on 2 L nasal cannula.  Clear to auscultation bilaterally, prolonged exhalation. Abdominal:     General: There is no distension.     Palpations: Abdomen is soft.     Tenderness: There is no abdominal tenderness.  Musculoskeletal:        General: No swelling or deformity.     Cervical back: Neck supple.  Lymphadenopathy:     Cervical: No cervical adenopathy.  Skin:    General: Skin is warm.     Findings: No rash.  Neurological:     General: No focal deficit present.     Mental Status: He is alert.     Coordination: Coordination normal.  Psychiatric:        Mood and Affect: Mood normal.        Behavior: Behavior normal.      CBC    Component Value Date/Time   WBC 3.7 01/26/2020 1127   RBC 4.48 01/26/2020 1127   HGB 14.3 01/26/2020 1127   HCT 42.6 01/26/2020 1127   PLT 166 01/26/2020 1127   MCV 95 01/26/2020 1127   MCH  31.9 01/26/2020 1127   MCHC 33.6 01/26/2020 1127   RDW 13.8 01/26/2020 1127   LYMPHSABS 1.7 01/26/2020 1127   EOSABS 0.1 01/26/2020 1127   BASOSABS 0.0 01/26/2020 1127    CHEMISTRY No results for input(s): NA, K, CL, CO2, GLUCOSE, BUN, CREATININE, CALCIUM, MG, PHOS in the last 168 hours. CrCl cannot be calculated (Patient's most recent lab result is older than the maximum 21 days allowed.).   Chest Imaging- films reviewed: HRCT chest 03/01/2020-severe emphysema, especially right upper lobe. Bibasilar linear scars.  Pulmonary Functions Testing Results: PFT Results Latest Ref Rng & Units 02/10/2020  FVC-Pre L 2.23  FVC-Predicted Pre % 62  FVC-Post L 2.32  FVC-Predicted Post % 65  Pre FEV1/FVC % % 44  Post FEV1/FCV % % 43  FEV1-Pre L 0.98  FEV1-Predicted Pre % 37  FEV1-Post L 1.00  DLCO UNC% % 30  DLCO COR %Predicted % 48  TLC L 6.15  TLC % Predicted % 89  RV % Predicted % 134   2021- Very severe obstruction without significant bronchodilator reversibility air trapping without hyperinflation is present. Severe diffusion impairment.   ONO on RA>> 02/15/2020-overnight oximetry on room air-duration of sleep 9 hours and 36 minutes, total of 451 minutes spent below 88%, SaO2 low 73%, basal oxygen level 85.6%  ONO on 2L>> 03/07/2020-overnight oximetry on 2 L-duration 8 hours and 30 minutes, time spent below 88% 132.9 minutes, SaO2 low 69% Assessment & Plan:     ICD-10-CM   1. 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 Chronic obstructive pulmonary disease, unspecified COPD type ([place])  J44.9   3. Chronic respiratory failure with hypoxia ([facility])  J96.11     Severe COPD -Continue Stiolto once daily.  He is instructed to continue this regardless of his symptom severity. -Continue albuterol every 4 hours as needed for breakthrough symptoms -Reviewed CT scan with Russell Marquez and his daughter today. -He remains apprehensive about the Covid vaccine.  We discussed the risks of Covid infection  given his underlying lung disease and the low risk of severe reaction to the Justin or maternal vaccines.  He is planning on getting his first pneumonia vaccine next week with his PCP. -Walked in the office today on supplemental oxygen.  He requires 2 L supplemental oxygen with ambulation.  Recommend an [employer] portable oxygen concentrator.  He still requires 4 L supplemental oxygen when sleeping.  PSG pending to evaluate for sleep apnea in addition to nocturnal hypoxia related to COPD.  Chronic hypoxic respiratory failure- need nocturnal O2  -Continue 2 L supplemental oxygen with activity -Continue 4 L supplemental oxygen at night.  PSG pending. -Okay to remain off oxygen at rest during the day as long as his saturations remain greater than 89%.   RTC in 3 months.    Current Outpatient Medications:  .  albuterol (VENTOLIN HFA) 108 (90 Base) MCG/ACT inhaler, Inhale into the lungs every 6 (six) hours as needed for wheezing or shortness of breath., Disp: , Rfl:  .  atorvastatin (LIPITOR) 40 MG tablet, TAKE 1 TABLET BY MOUTH ONCE DAILY, Disp: 90 tablet, Rfl: 1 .  bicalutamide (CASODEX) 50 MG tablet, , Disp: , Rfl:  .  oxybutynin (DITROPAN-XL) 5 MG 24 hr tablet, , Disp: , Rfl:  .  OXYGEN, Inhale 2 L into the lungs., Disp: , Rfl:  .  sertraline (ZOLOFT) 50 MG tablet, Take 50 mg by mouth daily., Disp: , Rfl:  .  tamsulosin (FLOMAX) 0.4 MG CAPS capsule, TAKE 1 CAPSULE BY MOUTH ONCE DAILY 1/2 HOUR BEFORE THE SAME MEAL EACH DAY., Disp: 90 capsule, Rfl: 1 .  Tiotropium Bromide Monohydrate (SPIRIVA RESPIMAT) 2.5 MCG/ACT AERS, , Disp: , Rfl:  .  Tiotropium Bromide-Olodaterol (STIOLTO RESPIMAT) 2.5-2.5 MCG/ACT AERS, Inhale 2 puffs into the lungs daily., Disp: 4 g, Rfl: 0 .  valsartan (DIOVAN) 80 MG tablet, , Disp: , Rfl:  .  XTANDI 40 MG capsule, Take 160 mg by mouth daily., Disp: , Rfl:    Russell Hy, DO [facility] 03/19/2020 9:50 AM

## 2020-03-20 ENCOUNTER — Ambulatory Visit: Payer: PPO | Admitting: Critical Care Medicine

## 2020-03-20 DIAGNOSIS — J449 Chronic obstructive pulmonary disease, unspecified: Secondary | ICD-10-CM | POA: Diagnosis not present

## 2020-03-22 DIAGNOSIS — C61 Malignant neoplasm of prostate: Secondary | ICD-10-CM | POA: Diagnosis not present

## 2020-03-22 DIAGNOSIS — C7951 Secondary malignant neoplasm of bone: Secondary | ICD-10-CM | POA: Diagnosis not present

## 2020-03-28 ENCOUNTER — Encounter: Payer: Self-pay | Admitting: Legal Medicine

## 2020-03-28 ENCOUNTER — Other Ambulatory Visit: Payer: Self-pay | Admitting: Legal Medicine

## 2020-03-28 DIAGNOSIS — C7951 Secondary malignant neoplasm of bone: Secondary | ICD-10-CM | POA: Insufficient documentation

## 2020-03-30 ENCOUNTER — Other Ambulatory Visit (HOSPITAL_COMMUNITY)
Admission: RE | Admit: 2020-03-30 | Discharge: 2020-03-30 | Disposition: A | Discharge status: Home / Self Care | Payer: PPO | Source: Ambulatory Visit | Attending: Pulmonary Disease | Admitting: Pulmonary Disease

## 2020-03-30 DIAGNOSIS — Z01812 Encounter for preprocedural laboratory examination: Secondary | ICD-10-CM | POA: Diagnosis not present

## 2020-03-30 DIAGNOSIS — Z20822 Contact with and (suspected) exposure to covid-19: Secondary | ICD-10-CM | POA: Insufficient documentation

## 2020-03-30 LAB — SARS CORONAVIRUS 2 (TAT 6-24 HRS): SARS Coronavirus 2: NEGATIVE

## 2020-04-01 ENCOUNTER — Ambulatory Visit (HOSPITAL_BASED_OUTPATIENT_CLINIC_OR_DEPARTMENT_OTHER): Payer: PPO | Attending: Pulmonary Disease | Admitting: Pulmonary Disease

## 2020-04-01 ENCOUNTER — Other Ambulatory Visit: Payer: Self-pay

## 2020-04-01 DIAGNOSIS — R0683 Snoring: Secondary | ICD-10-CM | POA: Diagnosis not present

## 2020-04-01 DIAGNOSIS — Z9189 Other specified personal risk factors, not elsewhere classified: Secondary | ICD-10-CM

## 2020-04-01 DIAGNOSIS — R0902 Hypoxemia: Secondary | ICD-10-CM | POA: Diagnosis not present

## 2020-04-03 NOTE — Procedures (Signed)
    Patient Name: Russell Marquez, Russell Marquez Date: 04/01/2020 Gender: Male D.O.B: 1941-08-12 Age (years): 11 Referring Provider: Lauraine Rinne NP Height (inches): 76 Interpreting Physician: Chesley Mires MD, ABSM Weight (lbs): 171 RPSGT: Gwenyth Allegra BMI: 26 MRN: [id number] Neck Size: 16.00  CLINICAL INFORMATION Sleep Study Type: NPSG  Indication for sleep study: Hypertension  Epworth Sleepiness Score: 6  SLEEP STUDY TECHNIQUE As per the AASM Manual for the Scoring of Sleep and Associated Events v2.3 (April 2016) with a hypopnea requiring 4% desaturations.  The channels recorded and monitored were frontal, central and occipital EEG, electrooculogram (EOG), submentalis EMG (chin), nasal and oral airflow, thoracic and abdominal wall motion, anterior tibialis EMG, snore microphone, electrocardiogram, and pulse oximetry.  MEDICATIONS Medications self-administered by patient taken the night of the study : N/A  SLEEP ARCHITECTURE The study was initiated at 9:50:11 PM and ended at 3:52:38 AM.  Sleep onset time was 32.8 minutes and the sleep efficiency was 64.8%%. The total sleep time was 235 minutes.  Stage REM latency was 59.0 minutes.  The patient spent 12.3%% of the night in stage N1 sleep, 79.8%% in stage N2 sleep, 0.0%% in stage N3 and 7.9% in REM.  Alpha intrusion was absent.  Supine sleep was 30.85%.  RESPIRATORY PARAMETERS The overall apnea/hypopnea index (AHI) was 2.6 per hour. There were 1 total apneas, including 1 obstructive, 0 central and 0 mixed apneas. There were 9 hypopneas and 16 RERAs.  The AHI during Stage REM sleep was 13.0 per hour.  AHI while supine was 7.4 per hour.  The mean oxygen saturation was 89.8%. The minimum SpO2 during sleep was 79.0%.  Spent 105.6 minutes of total sleep time with SpO2 < 88%.  Had 1 liter supplemental oxygen applied.  moderate snoring was noted during this study.  CARDIAC DATA The 2 lead EKG demonstrated sinus rhythm. The  mean heart rate was 74.1 beats per minute. Other EKG findings include: None.  LEG MOVEMENT DATA The total PLMS were 0 with a resulting PLMS index of 0.0. Associated arousal with leg movement index was 0.0 .  IMPRESSIONS - No significant obstructive sleep apnea occurred during this study (AHI = 2.6/h). - No significant central sleep apnea occurred during this study (CAI = 0.0/h). - Moderate oxygen desaturation was noted during this study (Min O2 = 79.0%).  Spent 105.6 minutes of total sleep time with SpO2 < 88%.  Had 1 liter supplemental oxygen applied. - The patient snored with moderate snoring volume. - No cardiac abnormalities were noted during this study. - Clinically significant periodic limb movements did not occur during sleep. No significant associated arousals.  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  [Electronically signed] 04/03/2020 03:56 PM  Chesley Mires MD, [facility], American Board of Sleep Medicine   NPI: [id number]

## 2020-04-04 ENCOUNTER — Telehealth: Payer: Self-pay | Admitting: Pulmonary Disease

## 2020-04-04 NOTE — Telephone Encounter (Signed)
Thanks

## 2020-04-04 NOTE — Telephone Encounter (Signed)
04/04/2020  We reviewed and received patient's split-night sleep study results.  That impression is listed below:  IMPRESSIONS - No significant obstructive sleep apnea occurred during this study (AHI = 2.6/h). - No significant central sleep apnea occurred during this study (CAI = 0.0/h). - Moderate oxygen desaturation was noted during this study (Min O2 = 79.0%).  Spent 105.6 minutes of total sleep time with SpO2 < 88%.  Had 1 liter supplemental oxygen applied. - The patient snored with moderate snoring volume. - No cardiac abnormalities were noted during this study. - Clinically significant periodic limb movements did not occur during sleep. No significant associated arousals.  Patient did not have any significant obstructive sleep apnea.  This is good news.  Patient did require 1 L of O2 at night.  Please continue to use 1 L of O2 at night as indicated based off the sleep study.  We'll route results to Dr. Carlis Abbott as Juluis Rainier.  Wyn Quaker, FNP

## 2020-04-05 NOTE — Telephone Encounter (Signed)
Patient is returning phone call. Patient phone number is [phone].

## 2020-04-05 NOTE — Telephone Encounter (Signed)
Called patient but he did not answer. Left message for him to call back.  

## 2020-04-06 NOTE — Telephone Encounter (Signed)
Spoke with pt. He is aware of results. Nothing further was needed. 

## 2020-04-06 NOTE — Telephone Encounter (Signed)
Patient is returning phone call. Patient phone number is [phone].

## 2020-04-10 DIAGNOSIS — C61 Malignant neoplasm of prostate: Secondary | ICD-10-CM | POA: Diagnosis not present

## 2020-04-10 DIAGNOSIS — R351 Nocturia: Secondary | ICD-10-CM | POA: Diagnosis not present

## 2020-04-26 DIAGNOSIS — C61 Malignant neoplasm of prostate: Secondary | ICD-10-CM | POA: Diagnosis not present

## 2020-04-26 DIAGNOSIS — C7951 Secondary malignant neoplasm of bone: Secondary | ICD-10-CM | POA: Diagnosis not present

## 2020-04-27 DIAGNOSIS — C7951 Secondary malignant neoplasm of bone: Secondary | ICD-10-CM | POA: Diagnosis not present

## 2020-04-27 DIAGNOSIS — C61 Malignant neoplasm of prostate: Secondary | ICD-10-CM | POA: Diagnosis not present

## 2020-05-25 ENCOUNTER — Other Ambulatory Visit: Payer: Self-pay

## 2020-05-25 ENCOUNTER — Ambulatory Visit (INDEPENDENT_AMBULATORY_CARE_PROVIDER_SITE_OTHER): Payer: PPO | Admitting: Legal Medicine

## 2020-05-25 ENCOUNTER — Encounter: Payer: Self-pay | Admitting: Legal Medicine

## 2020-05-25 VITALS — BP 130/80 | HR 81 | Temp 97.4°F | Resp 16 | Ht 68.0 in | Wt 166.0 lb

## 2020-05-25 DIAGNOSIS — J449 Chronic obstructive pulmonary disease, unspecified: Secondary | ICD-10-CM | POA: Diagnosis not present

## 2020-05-25 DIAGNOSIS — E782 Mixed hyperlipidemia: Secondary | ICD-10-CM | POA: Diagnosis not present

## 2020-05-25 DIAGNOSIS — C61 Malignant neoplasm of prostate: Secondary | ICD-10-CM | POA: Diagnosis not present

## 2020-05-25 DIAGNOSIS — M1A09X0 Idiopathic chronic gout, multiple sites, without tophus (tophi): Secondary | ICD-10-CM | POA: Diagnosis not present

## 2020-05-25 DIAGNOSIS — I10 Essential (primary) hypertension: Secondary | ICD-10-CM | POA: Diagnosis not present

## 2020-05-25 DIAGNOSIS — N1831 Chronic kidney disease, stage 3a: Secondary | ICD-10-CM | POA: Diagnosis not present

## 2020-05-25 MED ORDER — SPIRIVA RESPIMAT 2.5 MCG/ACT IN AERS: 2.0000 | INHALATION_SPRAY | Freq: Every day | RESPIRATORY_TRACT | 6 refills | Status: DC

## 2020-05-25 NOTE — Progress Notes (Signed)
Subjective:  Patient ID: Russell Marquez., male    DOB: Sep 23, 1941  Age: 79 y.o. MRN: [id number]  Chief Complaint  Patient presents with  . Hypertension  . COPD  . Hyperlipidemia    HPI: chronic visit  Patient presents for follow up of hypertension.  Patient tolerating valsartan well with side effects.  Patient was diagnosed with hypertension 2010 so has been treated for hypertension for 10 years.Patient is working on maintaining diet and exercise regimen and follows up as directed. Complication include none.  Patient presents with diagnosis of COPD.  It is not secondary to prolonged asthma.  Diagnosis years  Treatment includes tioproprim.  The diagnosis has not been hospitalized for this diagnosis. Last na.  Patient is compliant with regular use of medicines.  Patient presents with hyperlipidemia.  Compliance with treatment has been good; patient takes medicines as directed, maintains low cholesterol diet, follows up as directed, and maintains exercise regimen.  Patient is using atorvastatin without problems.  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 albuterol (VENTOLIN HFA) 108 (90 Base) MCG/ACT inhaler Inhale 2 puffs into the lungs every 4 (four) hours as needed for wheezing or shortness of breath. 18 g 11  . atorvastatin (LIPITOR) 40 MG tablet TAKE 1 TABLET BY MOUTH ONCE DAILY 90 tablet 1  . bicalutamide (CASODEX) 50 MG tablet     . oxybutynin (DITROPAN-XL) 5 MG 24 hr tablet     . OXYGEN Inhale 2 L into the lungs.    . sertraline (ZOLOFT) 50 MG tablet Take 50 mg by mouth daily.    . tamsulosin (FLOMAX) 0.4 MG CAPS capsule TAKE 1 CAPSULE BY MOUTH ONCE DAILY 1/2 HOUR BEFORE THE SAME MEAL EACH DAY. 90 capsule 1  . Tiotropium Bromide-Olodaterol (STIOLTO RESPIMAT) 2.5-2.5 MCG/ACT AERS Inhale 2 puffs into the lungs daily. 12 g 3  . valsartan (DIOVAN) 80 MG tablet     . XTANDI 40 MG capsule  Take 160 mg by mouth daily.    Marland Kitchen oxybutynin (DITROPAN) 5 MG tablet Take 5 mg by mouth daily.     No current facility-administered medications on file prior to visit.   Past Medical History:  Diagnosis Date  . Cancer of prostate ([place])   . Emphysema of lung ([place])   . Gout    Past Surgical History:  Procedure Laterality Date  . EYE SURGERY      Family History  Problem Relation Age of Onset  . Heart disease Mother   . COPD Father   . HIV Brother    Social History   Socioeconomic History  . Marital status: Widowed    Spouse name: Not on file  . Number of children: Not on file  . Years of education: Not on file  . Highest education level: Not on file  Occupational History  . Not on file  Tobacco Use  . Smoking status: Former Smoker    Packs/day: 1.00    Years: 27.00    Pack years: 27.00    Types: Cigarettes    Start date: 12/01/1956    Quit date: 12/01/1993    Years since quitting: 26.5  . Smokeless tobacco: Never Used  Vaping Use  . Vaping Use: Never used  Substance and Sexual Activity  . Alcohol use: Not on file  . Drug use: Not on file  . Sexual activity: Not on file  Other Topics Concern  .  Not on file  Social History Narrative  . Not on file   Social Determinants of Health   Financial Resource Strain: Low Risk   . Difficulty of Paying Living Expenses: Not very hard  Food Insecurity:   . Worried About Charity fundraiser in the Last Year:   . Arboriculturist in the Last Year:   Transportation Needs:   . Film/video editor (Medical):   Marland Kitchen Lack of Transportation (Non-Medical):   Physical Activity: Inactive  . Days of Exercise per Week: 0 days  . Minutes of Exercise per Session: 0 min  Stress: Stress Concern Present  . Feeling of Stress : To some extent  Social Connections:   . Frequency of Communication with Friends and Family:   . Frequency of Social Gatherings with Friends and Family:   . Attends Religious Services:   . Active Member of Clubs or  Organizations:   . Attends Archivist Meetings:   Marland Kitchen Marital Status:     Review of Systems  Constitutional: Negative.   HENT: Negative.   Eyes: Negative.   Respiratory: Positive for shortness of breath.   Cardiovascular: Negative.   Gastrointestinal: Negative.   Endocrine: Negative.   Musculoskeletal: Negative.   Skin: Negative.   Psychiatric/Behavioral: Negative.      Objective:  BP 130/80   Pulse 81   Temp (!) 97.4 F (36.3 C)   Resp 16   Ht 5\' 8"  (1.727 m)   Wt 166 lb (75.3 kg)   SpO2 90%   BMI 25.24 kg/m   BP/Weight 05/25/2020 04/01/2020 07/24/2352  Systolic BP 614 - 431  Diastolic BP 80 - 76  Wt. (Lbs) 166 171 171.4  BMI 25.24 26 26.06    Physical Exam Vitals reviewed.  Constitutional:      Appearance: Normal appearance.  HENT:     Head: Normocephalic and atraumatic.     Right Ear: Tympanic membrane normal.     Left Ear: Tympanic membrane normal.     Nose: Nose normal.     Mouth/Throat:     Mouth: Mucous membranes are moist.  Eyes:     Extraocular Movements: Extraocular movements intact.     Conjunctiva/sclera: Conjunctivae normal.     Pupils: Pupils are equal, round, and reactive to light.  Cardiovascular:     Rate and Rhythm: Normal rate and regular rhythm.     Pulses: Normal pulses.     Heart sounds: Normal heart sounds.  Pulmonary:     Effort: Pulmonary effort is normal.     Breath sounds: Normal breath sounds.  Abdominal:     General: Abdomen is flat. Bowel sounds are normal.     Palpations: Abdomen is soft.  Skin:    General: Skin is warm.     Capillary Refill: Capillary refill takes less than 2 seconds.  Neurological:     General: No focal deficit present.     Mental Status: He is alert. Mental status is at baseline.  Psychiatric:        Mood and Affect: Mood normal.        Behavior: Behavior normal.        Thought Content: Thought content normal.     Diabetic Foot Exam - Simple   No data filed       Lab Results    Component Value Date   WBC 3.2 (L) 05/25/2020   HGB 13.2 05/25/2020   HCT 37.1 (L) 05/25/2020   PLT 161 05/25/2020  GLUCOSE 102 (H) 05/25/2020   CHOL 127 05/25/2020   TRIG 100 05/25/2020   HDL 44 05/25/2020   LDLCALC 64 05/25/2020   ALT 7 05/25/2020   AST 15 05/25/2020   NA 142 05/25/2020   K 3.9 05/25/2020   CL 108 (H) 05/25/2020   CREATININE 1.06 05/25/2020   BUN 15 05/25/2020   CO2 23 05/25/2020      Assessment & Plan:   1. Mixed hyperlipidemia - Lipid panel AN INDIVIDUAL CARE PLAN for hyperlipidemia/ cholesterol was established and reinforced today.  The patient's status was assessed using clinical findings on exam, lab and other diagnostic tests. The patient's disease status was assessed based on evidence-based guidelines and found to be well controlled. MEDICATIONS were reviewed. SELF MANAGEMENT GOALS have been discussed and patient's success at attaining the goal of low cholesterol was assessed. RECOMMENDATION given include regular exercise 3 days a week and low cholesterol/low fat diet. CLINICAL SUMMARY including written plan to identify barriers unique to the patient due to social or economic  reasons was discussed.  2. Benign hypertension - CBC with Differential/Platelet - Comprehensive metabolic panel An individual hypertension care plan was established and reinforced today.  The patient's status was assessed using clinical findings on exam and labs or diagnostic tests. The patient's success at meeting treatment goals on disease specific evidence-based guidelines and found to be well controlled. SELF MANAGEMENT: The patient and I together assessed ways to personally work towards obtaining the recommended goals. RECOMMENDATIONS: avoid decongestants found in common cold remedies, decrease consumption of alcohol, perform routine monitoring of BP with home BP cuff, exercise, reduction of dietary salt, take medicines as prescribed, try not to miss doses and quit smoking.   Regular exercise and maintaining a healthy weight is needed.  Stress reduction may help. A CLINICAL SUMMARY including written plan identify barriers to care unique to individual due to social or financial issues.  We attempt to mutually creat solutions for individual and family understanding.  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 Malignant neoplasm of prostate [facility]) Patient is undergoing treatment for his prostate cancer  5. Chronic kidney disease, stage 3a AN INDIVIDUAL CARE PLAN for renal disease was established and reinforced today.  The patient's status was assessed using clinical findings on exam, labs, and other diagnostic testing. Patient's success at meeting treatment goals based on disease specific evidence-bassed guidelines and found to be in fair control. RECOMMENDATIONS include maintaining present medicines and treatment.  6. Chronic obstructive pulmonary disease, unspecified COPD type (HCC) - Tiotropium Bromide Monohydrate (SPIRIVA RESPIMAT) 2.5 MCG/ACT AERS; Take 2 puffs by mouth daily.  Dispense: 4 g; Refill: 6 An individualize plan was formulated for care of COPD.  Treatment is evidence based.  She will continue on inhalers, avoid smoking and smoke.  Regular exercise with help with dyspnea. Routine follow ups and medication compliance is needed.    Meds ordered this encounter  Medications  . Tiotropium Bromide Monohydrate (SPIRIVA RESPIMAT) 2.5 MCG/ACT AERS    Sig: Take 2 puffs by mouth daily.    Dispense:  4 g    Refill:  6    Orders Placed This Encounter  Procedures  . CBC with Differential/Platelet  . Comprehensive metabolic panel  . Lipid panel  . Cardiovascular Risk Assessment     Follow-up: Return in about 4 months (around 09/24/2020) for fasting.  An After Visit Summary was printed and given to the patient.  Reinaldo Meeker  [place] [phone]

## 2020-05-26 LAB — COMPREHENSIVE METABOLIC PANEL
ALT: 7 IU/L (ref 0–44)
AST: 15 IU/L (ref 0–40)
Albumin/Globulin Ratio: 1.8 (ref 1.2–2.2)
Albumin: 4.2 g/dL (ref 3.7–4.7)
Alkaline Phosphatase: 62 IU/L (ref 48–121)
BUN/Creatinine Ratio: 14 (ref 10–24)
BUN: 15 mg/dL (ref 8–27)
Bilirubin Total: 1 mg/dL (ref 0.0–1.2)
CO2: 23 mmol/L (ref 20–29)
Calcium: 8.7 mg/dL (ref 8.6–10.2)
Chloride: 108 mmol/L — ABNORMAL HIGH (ref 96–106)
Creatinine, Ser: 1.06 mg/dL (ref 0.76–1.27)
GFR calc Af Amer: 78 mL/min/{1.73_m2} (ref >59)
GFR calc non Af Amer: 67 mL/min/{1.73_m2} (ref >59)
Globulin, Total: 2.3 g/dL (ref 1.5–4.5)
Glucose: 102 mg/dL — ABNORMAL HIGH (ref 65–99)
Potassium: 3.9 mmol/L (ref 3.5–5.2)
Sodium: 142 mmol/L (ref 134–144)
Total Protein: 6.5 g/dL (ref 6.0–8.5)

## 2020-05-26 LAB — LIPID PANEL
Chol/HDL Ratio: 2.9 ratio (ref 0.0–5.0)
Cholesterol, Total: 127 mg/dL (ref 100–199)
HDL: 44 mg/dL (ref >39)
LDL Chol Calc (NIH): 64 mg/dL (ref 0–99)
Triglycerides: 100 mg/dL (ref 0–149)
VLDL Cholesterol Cal: 19 mg/dL (ref 5–40)

## 2020-05-26 LAB — CBC WITH DIFFERENTIAL/PLATELET
Basophils Absolute: 0 10*3/uL (ref 0.0–0.2)
Basos: 0 %
EOS (ABSOLUTE): 0.1 10*3/uL (ref 0.0–0.4)
Eos: 2 %
Hematocrit: 37.1 % — ABNORMAL LOW (ref 37.5–51.0)
Hemoglobin: 13.2 g/dL (ref 13.0–17.7)
Immature Grans (Abs): 0 10*3/uL (ref 0.0–0.1)
Immature Granulocytes: 0 %
Lymphocytes Absolute: 1.4 10*3/uL (ref 0.7–3.1)
Lymphs: 43 %
MCH: 33.1 pg — ABNORMAL HIGH (ref 26.6–33.0)
MCHC: 35.6 g/dL (ref 31.5–35.7)
MCV: 93 fL (ref 79–97)
Monocytes Absolute: 0.4 10*3/uL (ref 0.1–0.9)
Monocytes: 13 %
Neutrophils Absolute: 1.3 10*3/uL — ABNORMAL LOW (ref 1.4–7.0)
Neutrophils: 42 %
Platelets: 161 10*3/uL (ref 150–450)
RBC: 3.99 x10E6/uL — ABNORMAL LOW (ref 4.14–5.80)
RDW: 12.9 % (ref 11.6–15.4)
WBC: 3.2 10*3/uL — ABNORMAL LOW (ref 3.4–10.8)

## 2020-05-26 LAB — CARDIOVASCULAR RISK ASSESSMENT

## 2020-05-26 NOTE — Progress Notes (Signed)
Mild anemia, wbc low also, glucose 102, kidney tests normal, liver tests normal, Cholesterol normal,  lp

## 2020-05-28 DIAGNOSIS — C61 Malignant neoplasm of prostate: Secondary | ICD-10-CM | POA: Diagnosis not present

## 2020-05-28 DIAGNOSIS — C7951 Secondary malignant neoplasm of bone: Secondary | ICD-10-CM | POA: Diagnosis not present

## 2020-05-31 ENCOUNTER — Other Ambulatory Visit: Payer: Self-pay | Admitting: Legal Medicine

## 2020-05-31 ENCOUNTER — Ambulatory Visit: Payer: PPO

## 2020-05-31 DIAGNOSIS — N3946 Mixed incontinence: Secondary | ICD-10-CM

## 2020-05-31 DIAGNOSIS — I10 Essential (primary) hypertension: Secondary | ICD-10-CM

## 2020-05-31 DIAGNOSIS — E782 Mixed hyperlipidemia: Secondary | ICD-10-CM

## 2020-05-31 MED ORDER — OXYBUTYNIN CHLORIDE 5 MG PO TABS: 5.0000 mg | ORAL_TABLET | Freq: Three times a day (TID) | ORAL | 5 refills | Status: AC

## 2020-05-31 NOTE — Patient Instructions (Addendum)
Visit Information  Goals Addressed            This Visit's Progress   . Pharmacy Care Plan       CARE PLAN ENTRY  Current Barriers:  . Chronic Disease Management support, education, and care coordination needs related to HTN, HLD, and COPD  Pharmacist Clinical Goal(s):  . Ensure safety, efficacy, and affordability of medications . Recommend maintaining BP <140/90 mmHg  . Goal Cholesterol: TC <200, LDL <100, TG <150 HDL >50 mg/dL  Interventions: . Comprehensive medication review performed. . Recommend engaging in moderate intensity exercise 5 times each week.  . Recommend following the Dash diet guidance of incorporating fruits, vegetables, whole-grains, low-fat dairy products, lean meats, legumes and nuts for optimal health. . Recommend keeping sodium intake less than 1500 mg/day.  . Recommend increase in oxybutynin to better treat symptoms of frequent urination.    Patient Self Care Activities:  . Patient will monitor for improvement in sleep with increased dose of oxybutynin.  . Patient indicates that he would like to begin building up his walking.   Please see past updates related to this goal by clicking on the "Past Updates" button in the selected goal         The patient verbalized understanding of instructions provided today and declined a print copy of patient instruction materials.   Telephone follow up appointment with pharmacy team member scheduled for: 08-15-2020  Sherre Poot, PharmD, [facility] [place] Clinical Pharmacist [facility] [phone] (office) [phone] (mobile)  Overactive Bladder, Adult  Overactive bladder refers to a condition in which a person has a sudden need to pass urine. The person may leak urine if he or she cannot get to the bathroom fast enough (urinary incontinence). A person with this condition may also wake up several times in the night to go to the bathroom. Overactive bladder is associated with poor nerve signals between  your bladder and your brain. Your bladder may get the signal to empty before it is full. You may also have very sensitive muscles that make your bladder squeeze too soon. These symptoms might interfere with daily work or social activities. What are the causes? This condition may be associated with or caused by:  Urinary tract infection.  Infection of nearby tissues, such as the prostate.  Prostate enlargement.  Surgery on the uterus or urethra.  Bladder stones, inflammation, or tumors.  Drinking too much caffeine or alcohol.  Certain medicines, especially medicines that get rid of extra fluid in the body (diuretics).  Muscle or nerve weakness, especially from: ? A spinal cord injury. ? Stroke. ? Multiple sclerosis. ? Parkinson's disease.  Diabetes.  Constipation. What increases the risk? You may be at greater risk for overactive bladder if you:  Are an older adult.  Smoke.  Are going through menopause.  Have prostate problems.  Have a neurological disease, such as stroke, dementia, Parkinson's disease, or multiple sclerosis (MS).  Eat or drink things that irritate the bladder. These include alcohol, spicy food, and caffeine.  Are overweight or obese. What are the signs or symptoms? Symptoms of this condition include:  Sudden, strong urge to urinate.  Leaking urine.  Urinating 8 or more times a day.  Waking up to urinate 2 or more times a night. How is this diagnosed? Your health care provider may suspect overactive bladder based on your symptoms. He or she will diagnose this condition by:  A physical exam and medical history.  Blood or urine tests.  You might need bladder or urine tests to help determine what is causing your overactive bladder. You might also need to see a health care provider who specializes in urinary tract problems (urologist). How is this treated? Treatment for overactive bladder depends on the cause of your condition and whether it is  mild or severe. You can also make lifestyle changes at home. Options include:  Bladder training. This may include: ? Learning to control the urge to urinate by following a schedule that directs you to urinate at regular intervals (timed voiding). ? Doing Kegel exercises to strengthen your pelvic floor muscles, which support your bladder. Toning these muscles can help you control urination, even if your bladder muscles are overactive.  Special devices. This may include: ? Biofeedback, which uses sensors to help you become aware of your body's signals. ? Electrical stimulation, which uses electrodes placed inside the body (implanted) or outside the body. These electrodes send gentle pulses of electricity to strengthen the nerves or muscles that control the bladder. ? Women may use a plastic device that fits into the vagina and supports the bladder (pessary).  Medicines. ? Antibiotics to treat bladder infection. ? Antispasmodics to stop the bladder from releasing urine at the wrong time. ? Tricyclic antidepressants to relax bladder muscles. ? Injections of botulinum toxin type A directly into the bladder tissue to relax bladder muscles.  Lifestyle changes. This may include: ? Weight loss. Talk to your health care provider about weight loss methods that would work best for you. ? Diet changes. This may include reducing how much alcohol and caffeine you consume, or drinking fluids at different times of the day. ? Not smoking. Do not use any products that contain nicotine or tobacco, such as cigarettes and e-cigarettes. If you need help quitting, ask your health care provider.  Surgery. ? A device may be implanted to help manage the nerve signals that control urination. ? An electrode may be implanted to stimulate electrical signals in the bladder. ? A procedure may be done to change the shape of the bladder. This is done only in very severe cases. Follow these instructions at  home: Lifestyle  Make any diet or lifestyle changes that are recommended by your health care provider. These may include: ? Drinking less fluid or drinking fluids at different times of the day. ? Cutting down on caffeine or alcohol. ? Doing Kegel exercises. ? Losing weight if needed. ? Eating a healthy and balanced diet to prevent constipation. This may include:  Eating foods that are high in fiber, such as fresh fruits and vegetables, whole grains, and beans.  Limiting foods that are high in fat and processed sugars, such as fried and sweet foods. General instructions  Take over-the-counter and prescription medicines only as told by your health care provider.  If you were prescribed an antibiotic medicine, take it as told by your health care provider. Do not stop taking the antibiotic even if you start to feel better.  Use any implants or pessary as told by your health care provider.  If needed, wear pads to absorb urine leakage.  Keep a journal or log to track how much and when you drink and when you feel the need to urinate. This will help your health care provider monitor your condition.  Keep all follow-up visits as told by your health care provider. This is important. Contact a health care provider if:  You have a fever.  Your symptoms do not get better with treatment.  Your pain and discomfort get worse.  You have more frequent urges to urinate. Get help right away if:  You are not able to control your bladder. Summary  Overactive bladder refers to a condition in which a person has a sudden need to pass urine.  Several conditions may lead to an overactive bladder.  Treatment for overactive bladder depends on the cause and severity of your condition.  Follow your health care provider's instructions about lifestyle changes, doing Kegel exercises, keeping a journal, and taking medicines. This information is not intended to replace advice given to you by your health  care provider. Make sure you discuss any questions you have with your health care provider. Document Revised: 03/10/2019 Document Reviewed: 12/03/2017 Elsevier Patient Education  [place].

## 2020-05-31 NOTE — Chronic Care Management (AMB) (Signed)
Chronic Care Management Pharmacy  Name: Russell Marquez.  MRN: [id number] DOB: 04-06-1941  Chief Complaint/ HPI  Russell Marquez.,  79 y.o. , male presents for their Follow-Up CCM visit with the clinical pharmacist via telephone due to COVID-19 Pandemic.  PCP : Lillard Anes, MD  Their chronic conditions include: HTN, Emphysema, COPD, CKD, HLD, Tremor, depression, Enlarged prostate, and prostate cancer.   Office Visits: 05/25/2020 - Mild anemia, wbc low also, glucose 102, kidney tests normal, liver tests normal, Cholesterol normal. Refill Spiriva respimat.  01/26/2020 - Chronic dz visit. No anemia, kidney function stable, cholesterol normal, PSA WNL.   01/09/2020 - referred to eye doctor for blurred vision.  12/27/2019 -  Follow-up for major depression symptoms. No med changes.  12/13/2019 - Seen for symptoms of major depression. Started sertraline.   11/07/2019 - Chronic visit. Virtussin renewed.     Consult Visit: 04/27/2020 -  PSA undetectable. Continue denosumab shot monthly. Add Calcium with d for goal of 1200 mg/day.   04/04/2020 - Pulmonology - Patient did not have any significant obstructive sleep apnea.  This is good news.  Patient did require 1 L of O2 at night.  Please continue to use 1 L of O2 at night as indicated based off the sleep study. 04/01/2020 - Sleep Med - sleep study 03/19/2020 - continue stiolto daily.  02/21/2020 - Overnight sleep study determined patient needed 2L of O2 overnight.  02/14/2020 - Pulmonology ordered CT scan of lungs to evaluate scarring. Continue Stiolto respimat. Recommended Pneumovax 23, flu and COVID 19 vaccine.  12/30/2019 - Dr. Comer Locket for malignant neoplasm of prostate. 12/15/2019 - Pulmonology started Stiolto.  09/01/2019 - Malignant neoplasm   Medications: Outpatient Encounter Medications as of 05/31/2020  Medication Sig  . albuterol (VENTOLIN HFA) 108 (90 Base) MCG/ACT inhaler Inhale 2 puffs into the lungs  every 4 (four) hours as needed for wheezing or shortness of breath.  Marland Kitchen atorvastatin (LIPITOR) 40 MG tablet TAKE 1 TABLET BY MOUTH ONCE DAILY  . Calcium Carbonate-Vitamin D (CALCIUM 500 + D) 500-125 MG-UNIT TABS Take 1 tablet by mouth in the morning and at bedtime.  . Calcium Citrate-Vitamin D (CITRACAL PETITES/VITAMIN D) 200-250 MG-UNIT TABS Take 3 tablets by mouth in the morning and at bedtime.  Marland Kitchen oxybutynin (DITROPAN) 5 MG tablet Take 5 mg by mouth daily.  . OXYGEN Inhale 2 L into the lungs.  . sertraline (ZOLOFT) 50 MG tablet Take 50 mg by mouth daily.  . tamsulosin (FLOMAX) 0.4 MG CAPS capsule TAKE 1 CAPSULE BY MOUTH ONCE DAILY 1/2 HOUR BEFORE THE SAME MEAL EACH DAY.  Marland Kitchen Tiotropium Bromide Monohydrate (SPIRIVA RESPIMAT) 2.5 MCG/ACT AERS Take 2 puffs by mouth daily.  . valsartan (DIOVAN) 80 MG tablet   . XTANDI 40 MG capsule Take 160 mg by mouth daily.  . bicalutamide (CASODEX) 50 MG tablet  (Patient not taking: Reported on 05/31/2020)  . oxybutynin (DITROPAN-XL) 5 MG 24 hr tablet  (Patient not taking: Reported on 05/31/2020)  . Tiotropium Bromide-Olodaterol (STIOLTO RESPIMAT) 2.5-2.5 MCG/ACT AERS Inhale 2 puffs into the lungs daily. (Patient not taking: Reported on 05/31/2020)   No facility-administered encounter medications on file as of 05/31/2020.     Current Diagnosis/Assessment:  Goals Addressed            This Visit's Progress   . Pharmacy Care Plan       CARE PLAN ENTRY  Current Barriers:  . Chronic Disease Management support, education, and care coordination needs  related to HTN, HLD, and COPD  Pharmacist Clinical Goal(s):  . Ensure safety, efficacy, and affordability of medications . Recommend maintaining BP <140/90 mmHg  . Goal Cholesterol: TC <200, LDL <100, TG <150 HDL >50 mg/dL  Interventions: . Comprehensive medication review performed. . Recommend engaging in moderate intensity exercise 5 times each week.  . Recommend following the Dash diet guidance of  incorporating fruits, vegetables, whole-grains, low-fat dairy products, lean meats, legumes and nuts for optimal health. . Recommend keeping sodium intake less than 1500 mg/day.  . Recommend increase in oxybutynin to better treat symptoms of frequent urination.    Patient Self Care Activities:  . Patient will monitor for improvement in sleep with increased dose of oxybutynin.  . Patient indicates that he would like to begin building up his walking.   Please see past updates related to this goal by clicking on the "Past Updates" button in the selected goal         COPD / Asthma / Tobacco   Eosinophil count:  No results found for: EOSPCT%                               Eos (Absolute):  Lab Results  Component Value Date/Time   EOSABS 0.1 05/25/2020 10:26 AM    Tobacco Status:  Social History   Tobacco Use  Smoking Status Former Smoker  . Packs/day: 1.00  . Years: 27.00  . Pack years: 27.00  . Types: Cigarettes  . Start date: 12/01/1956  . Quit date: 12/01/1993  . Years since quitting: 26.5  Smokeless Tobacco Never Used    Patient has failed these meds in past: anoro inhale, , stiolto respimat Patient is currently controlled on the following medications: albuterol inhaler, spiriva respimat daily, oxygen Using maintenance inhaler regularly? Yes Frequency of rescue inhaler use:  2-3x per week  We discussed:  Patient is seeing pulmonology to address breathing issues. He says they are still doing scans and test to determine the cause of breathing issues. Patient hopes to begin building up stamina to walk and participate in light exercise.   Update 05/31/2020 - Patient indicates that Spiriva is covering his symptoms well. He is on oxygen at home. No issues reported at this time.   Plan  Continue current medications  and  Hypertension   Office blood pressures are  BP Readings from Last 3 Encounters:  02/10/20 128/80  01/26/20 130/84   Patient has failed these meds in the  past: valsartan-hctz Patient is currently controlled on the following medications: Valsartan 80 mg   Patient checks BP at home weekly  Patient home BP readings are ranging: 128/80  We discussed diet and exercise extensively. Patient is working to reduce sodium in diet.   Update 05/31/2020 - Patient reports good control and compliance with therapy.   Plan  Continue current medications   Hyperlipidemia   Lipid Panel     Component Value Date/Time   CHOL 127 05/25/2020 1026   TRIG 100 05/25/2020 1026   HDL 44 05/25/2020 1026   CHOLHDL 2.9 05/25/2020 1026   LDLCALC 64 05/25/2020 1026   LABVLDL 19 05/25/2020 1026     The ASCVD Risk score (Goff DC Jr., et al., 2013) failed to calculate for the following reasons:   The valid total cholesterol range is 130 to 320 mg/dL   Patient has failed these meds in past: n/a Patient is currently controlled on the following medications: atorvastatin  40 mg   We discussed:  diet and exercise extensively. Patient indicates that he is eating more chicken. He no longer drinks alcohol and is working to reduce red meat.  Update 05/31/2020 - Patient reports good control and compliance with therapy.   Plan  Continue current medications   Depression   Patient has failed these meds in past: zoloft Patient is currently controlled on the following medications: n/a currently  We discussed:  Patient did not refill sertraline. He reports that crying and nervousness is much better since he is staying at home a little more. His daughter from [place] is staying with him to help him care for his sister. Patient indicates that he is feeling much better.   Update 05/31/2020 - Patient reports that stress of caregiver is still there but managing well.   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 His oncologist has recently retired and is seeing a new one. He reports that medications are both affordable and seem to be helping.   Update 05/31/2020 - Patient is experiencing worsening of bladder symptoms lately. He can't drive for very long without having to pull over to urinate. He is waking up many times during the night and lifestyle limited by frequent urination.  Plan  Recommend increasing oxybutynin to reduce frequent urination.   Gout?   Patient has failed these meds in past: allopurinol Patient is currently controlled on the following medications: n/a  We discussed: Patient is not currently dealing with Gout. We discussed that his improved diet is also helping prevent future Gout episodes. Not currently treating preventatively.   Update 05/31/2020 - Patient well controlled at this time.   Plan  Continue control with diet.   Vaccines   Reviewed and discussed patient's vaccination history. Patient has previously declined COVID and Pneumonia vaccine recommendation from pulmonology. We discussed risk/benefits of vaccines. He plans to let them know at his visit tomorrow that he would like to receive.   Plan  Recommended patient receive COVID and Pneumonia vaccine.   Health Maintenance   Patient is currently controlled on the following medications:   Takes naproxen very seldom OTC to treat pain or headache.   We discussed:  diet and exercise extensively   Update 05/31/2020 - Patient has been started on Calcium with D (1200 mg/day of calcium) due to monthly denosumab shot at cancer center. Patient has been tolerating well but dislikes the 6 tablets daily. Patient purchased the petite sized calcium due to easily swallowed. Patient is aware of daily recommended calcium dose.   Plan  Continue current medications   Medication  Management   Pt uses [name] for all medications and uses a weekly pill planner to keep medications organized.  Pt endorses good compliance - rarely misses doses.   We discussed: Patient's good adherence aids already in place.   Update 05/31/2020 - Patient will pick up prescription at pharmacy today.   Plan Patient to continue taking medications as prescribed.   Follow up: 1 month with Pharmacist

## 2020-06-27 DIAGNOSIS — C7951 Secondary malignant neoplasm of bone: Secondary | ICD-10-CM | POA: Diagnosis not present

## 2020-06-27 DIAGNOSIS — C61 Malignant neoplasm of prostate: Secondary | ICD-10-CM | POA: Diagnosis not present

## 2020-06-28 ENCOUNTER — Encounter: Payer: Self-pay | Admitting: Primary Care

## 2020-06-28 ENCOUNTER — Telehealth: Payer: Self-pay | Admitting: Primary Care

## 2020-06-28 ENCOUNTER — Ambulatory Visit: Payer: PPO | Admitting: Primary Care

## 2020-06-28 ENCOUNTER — Other Ambulatory Visit: Payer: Self-pay

## 2020-06-28 VITALS — BP 130/70 | HR 73 | Temp 97.2°F | Ht 68.0 in | Wt 163.2 lb

## 2020-06-28 DIAGNOSIS — J449 Chronic obstructive pulmonary disease, unspecified: Secondary | ICD-10-CM

## 2020-06-28 DIAGNOSIS — R040 Epistaxis: Secondary | ICD-10-CM | POA: Insufficient documentation

## 2020-06-28 DIAGNOSIS — J431 Panlobular emphysema: Secondary | ICD-10-CM | POA: Diagnosis not present

## 2020-06-28 DIAGNOSIS — J9611 Chronic respiratory failure with hypoxia: Secondary | ICD-10-CM

## 2020-06-28 MED ORDER — STIOLTO RESPIMAT 2.5-2.5 MCG/ACT IN AERS
2.0000 | INHALATION_SPRAY | Freq: Every day | RESPIRATORY_TRACT | 0 refills | Status: DC
Start: 2020-06-28 — End: 2020-06-28

## 2020-06-28 MED ORDER — TIOTROPIUM BROMIDE-OLODATEROL 2.5-2.5 MCG/ACT IN AERS
2.0000 | INHALATION_SPRAY | Freq: Every day | RESPIRATORY_TRACT | 5 refills | Status: AC
Start: 2020-06-28 — End: ?

## 2020-06-28 NOTE — Progress Notes (Signed)
@Patient  ID: Russell Marquez., male    DOB: 1941/02/07, 79 y.o.   MRN: [id number]  Chief Complaint  Patient presents with  . Follow-up    COPD    Referring provider: Lillard Anes  HPI: 79 year old male, former smoker.  Past medical history significant for emphysema, COPD, chronic hypoxic respiratory failure. Patient of Dr. Carlis Abbott, last seen 03/19/2020.  He had a split-night sleep study that showed no significant obstructive sleep apnea, AHI 2.6/hour.  Previous [facility] pulmonary encounters: Mr. Hedglin is a 79 year old gentleman with a history of COPD and chronic hypoxic respiratory failure who presents for follow-up.  He is accompanied today by his daughter Lisabeth Pick.  His breathing is significantly improved on Stiolto once daily.  Prescribed 4 L supplemental oxygen in January, but at his follow-up appointment in March with Wyn Quaker, NP his saturations are okay on room air.  He has had 2 overnight oximetry studies done where he required supplemental oxygen, but needs a PSG for further evaluation.  At home his oxygen saturations at rest are "good", but he has seen them drop as low as 85% on room air when he is walking around.  He has had no worsening of his symptoms with weather changes or pollen/allergens.  Stress is the biggest trigger for him making his breathing worse.  He uses his rescue inhaler about once per week.  He and his daughter have discussed the recommended vaccines.  He is planning on having his first pneumonia vaccine next week with his PCP.  He is concerned about the Covid vaccine still.  OV 12/15/19: Mr. Uzelac is a 79 year old gentleman who presents for evaluation of dyspnea on exertion.  He has previously been diagnosed clinically by his primary care physician with COPD.  He has tried multiple inhalers in the past without perceived benefit.  He thinks albuterol works better than long-acting inhalers, but uses it about once per day.  He has severe dyspnea with exertion  limiting his activity.  He has shortness of breath even with talking.  He is wheezing, but denies coughing or sputum production.  He quit smoking about 13 years ago after about 35 years x 0.75 ppd.  He never had dyspnea until a few years ago.  He denies significant allergy symptoms, heartburn, chest pain, or leg edema.  He has been wearing a mask and generally staying home due to Covid.  He has had pneumonia shots in the past with his primary care provider, but has not had his seasonal flu shot or Covid vaccine.  Days worried about the Covid vaccine.  His father had COPD.  He has multiple siblings who have died last few years and a sister who is in poor health.  He has had insomnia, anxiety, and frequent tearfulness.  He was recently started on Zoloft.  Although he is trying to help take care of his sister and facilitate her remaining home, he knows that he is limited due to his symptoms.   06/28/2020- interim hx  Patient presents today for 3 month follow-up. He is doing well. 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  Reports that he has been using Stiolto Respimat, he received a sample of medication at last visit. Continued to wear 2L oxygen at rest and on exertion. He would like to look into getting [employer] portable oxygen concentrator, states current tanks are too large and inconvient for him to carry. He had several nose bleeds last  week which stopped after 2 mins. Denies fever, chest tightness, wheezing or cough.     Chest Imaging- films reviewed: HRCT chest 03/01/2020-severe emphysema, especially right upper lobe. Bibasilar linear scars.  Pulmonary Functions Testing Results: PFT Results Latest Ref Rng & Units 02/10/2020  FVC-Pre L 2.23  FVC-Predicted Pre % 62  FVC-Post L 2.32  FVC-Predicted Post % 65  Pre FEV1/FVC % % 44  Post FEV1/FCV % % 43  FEV1-Pre L 0.98  FEV1-Predicted Pre % 37  FEV1-Post L 1.00  DLCO UNC% % 30  DLCO COR  %Predicted % 48  TLC L 6.15  TLC % Predicted % 89  RV % Predicted % 134   2021- Very severe obstruction without significant bronchodilator reversibility air trapping without hyperinflation is present. Severe diffusion impairment.   ONO on RA>> 02/15/2020-overnight oximetry on room air-duration of sleep 9 hours and 36 minutes, total of 451 minutes spent below 88%, SaO2 low 73%, basal oxygen level 85.6%  ONO on 2L>> 03/07/2020-overnight oximetry on 2 L-duration 8 hours and 30 minutes, time spent below 88% 132.9 minutes, SaO2 low 69%  No Known Allergies   There is no immunization history on file for this patient.  Past Medical History:  Diagnosis Date  . Cancer of prostate ([place])   . Emphysema of lung ([place])   . Gout     Tobacco History: Social History   Tobacco Use  Smoking Status Former Smoker  . Packs/day: 1.00  . Years: 27.00  . Pack years: 27.00  . Types: Cigarettes  . Start date: 12/01/1956  . Quit date: 12/01/1993  . Years since quitting: 26.5  Smokeless Tobacco Never Used   Counseling given: Not Answered   Outpatient Medications Prior to Visit  Medication Sig Dispense Refill  . albuterol (VENTOLIN HFA) 108 (90 Base) MCG/ACT inhaler Inhale 2 puffs into the lungs every 4 (four) hours as needed for wheezing or shortness of breath. 18 g 11  . atorvastatin (LIPITOR) 40 MG tablet TAKE 1 TABLET BY MOUTH ONCE DAILY 90 tablet 1  . bicalutamide (CASODEX) 50 MG tablet     . Calcium Carbonate-Vitamin D (CALCIUM 500 + D) 500-125 MG-UNIT TABS Take 1 tablet by mouth in the morning and at bedtime.    . Calcium Citrate-Vitamin D (CITRACAL PETITES/VITAMIN D) 200-250 MG-UNIT TABS Take 3 tablets by mouth in the morning and at bedtime.    Marland Kitchen oxybutynin (DITROPAN) 5 MG tablet Take 1 tablet (5 mg total) by mouth 3 (three) times daily. 90 tablet 5  . OXYGEN Inhale 2 L into the lungs.    . sertraline (ZOLOFT) 50 MG tablet Take 50 mg by mouth daily.    . valsartan (DIOVAN) 80 MG tablet      . XTANDI 40 MG capsule Take 160 mg by mouth daily.    . tamsulosin (FLOMAX) 0.4 MG CAPS capsule TAKE 1 CAPSULE BY MOUTH ONCE DAILY 1/2 HOUR BEFORE THE SAME MEAL EACH DAY. 90 capsule 1  . Tiotropium Bromide Monohydrate (SPIRIVA RESPIMAT) 2.5 MCG/ACT AERS Take 2 puffs by mouth daily. 4 g 6  . Tiotropium Bromide-Olodaterol (STIOLTO RESPIMAT) 2.5-2.5 MCG/ACT AERS Inhale 2 puffs into the lungs daily. 12 g 3   No facility-administered medications prior to visit.    Review of Systems  Review of Systems  Constitutional: Negative.   Respiratory: Positive for shortness of breath. Negative for cough, chest tightness and wheezing.   Cardiovascular: Negative.    Physical Exam  BP (!) 130/70 (BP Location: Left Arm, Cuff  Size: Normal)   Pulse 73   Temp (!) 97.2 F (36.2 C) (Oral)   Ht 5\' 8"  (1.727 m)   Wt 163 lb 3.2 oz (74 kg)   SpO2 97%   BMI 24.81 kg/m  Physical Exam Constitutional:      General: He is not in acute distress.    Appearance: Normal appearance. He is not ill-appearing.  HENT:     Head: Normocephalic and atraumatic.     Nose:     Comments: Possible left sided deviated septum     Mouth/Throat:     Mouth: Mucous membranes are moist.     Pharynx: Oropharynx is clear.  Cardiovascular:     Rate and Rhythm: Normal rate and regular rhythm.  Pulmonary:     Effort: Pulmonary effort is normal.     Breath sounds: Normal breath sounds. No wheezing or rhonchi.  Neurological:     General: No focal deficit present.     Mental Status: He is alert and oriented to person, place, and time. Mental status is at baseline.  Psychiatric:        Mood and Affect: Mood normal.        Behavior: Behavior normal.        Thought Content: Thought content normal.        Judgment: Judgment normal.      Lab Results:  CBC    Component Value Date/Time   WBC 3.2 (L) 05/25/2020 1026   RBC 3.99 (L) 05/25/2020 1026   HGB 13.2 05/25/2020 1026   HCT 37.1 (L) 05/25/2020 1026   PLT 161 05/25/2020  1026   MCV 93 05/25/2020 1026   MCH 33.1 (H) 05/25/2020 1026   MCHC 35.6 05/25/2020 1026   RDW 12.9 05/25/2020 1026   LYMPHSABS 1.4 05/25/2020 1026   EOSABS 0.1 05/25/2020 1026   BASOSABS 0.0 05/25/2020 1026    BMET    Component Value Date/Time   NA 142 05/25/2020 1026   K 3.9 05/25/2020 1026   CL 108 (H) 05/25/2020 1026   CO2 23 05/25/2020 1026   GLUCOSE 102 (H) 05/25/2020 1026   BUN 15 05/25/2020 1026   CREATININE 1.06 05/25/2020 1026   CALCIUM 8.7 05/25/2020 1026   GFRNONAA 67 05/25/2020 1026   GFRAA 78 05/25/2020 1026    BNP No results found for: BNP  ProBNP No results found for: PROBNP  Imaging: No results found.   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[place]) - PFT March 2021 showed FEV1 1.0L (38%), ratio 43 - Stable interval for patient, no acute complaints  - Continue Stiolto Respimat two puffs once daily (benefits investigation reveals zero copay) - Stop Spiriva Respimat    Chronic respiratory failure with hypoxia ([place]) - Patient maintained on 2L/min continuous oxygen at rest and on exertion - Patient qualified for POC today needing 3L pulsed to keep O2 >90% - Order sent to [facility] for portable oxygen concentrator   Epistaxis - Recommend ocean nasal spray twice daily and ARY nasal gel - If continued needs referral to ENT    Martyn Ehrich, NP 06/28/2020

## 2020-06-28 NOTE — Assessment & Plan Note (Signed)
-   Patient maintained on 2L/min continuous oxygen at rest and on exertion - Patient qualified for POC today needing 3L pulsed to keep O2 >90% - Order sent to DME company for portable oxygen concentrator

## 2020-06-28 NOTE — Telephone Encounter (Signed)
Can you do a benefits investigation for Stiolto?

## 2020-06-28 NOTE — Telephone Encounter (Signed)
POC order sent to [facility] we got response from [place] with Valeta Harms, Dyanne Carrel, Alphonzo Lemmings, [place]; Ricardo Jericho; [employer], Wells Guiles We are unable to provide poc for this patient.   Thank you,   Magda Paganini   I called and spoke with Kenney Houseman with [employer] asking why?  She stated the patient had a POC 05/2016 and it was returned 12/2019 his liter flow changed.. There was an order placed in 03/2020 for POC and at that time they couldn't give him POC. [employer] offered him the small ML6 tanks and patient refused wanted to want and speak with the doctor. The new POC order was placed again today and [facility] still can't give him a POC they have the order on file for ML6 tanks. If the patients will take the smaller tanks then we will need to place a new order for the ML6 tanks with the liter flow

## 2020-06-28 NOTE — Telephone Encounter (Signed)
Ran test claim for 1 month supply of Stiolto, patient has zero copay

## 2020-06-28 NOTE — Assessment & Plan Note (Addendum)
-   PFT March 2021 showed FEV1 1.0L (38%), ratio 43 - Stable interval for patient, no acute complaints  - Continue Stiolto Respimat two puffs once daily (benefits investigation reveals zero copay) - Stop Spiriva Respimat

## 2020-06-28 NOTE — Telephone Encounter (Signed)
Called and spoke with pt letting him know the info stated by [place] after speaking with [employer] rep Kenney Houseman. Pt verbalized understanding and stated he would think about this if he wanted to go through with obtaining the smaller mL6 tanks. Pt stated he would call us back if he wanted to pursue this. Nothing further needed.

## 2020-06-28 NOTE — Patient Instructions (Addendum)
COPD: - Continue Stiolto Respimat (Tiotropium Bromide-Olodaterol) two puffs once daily  - Stop Spiriva Respimat (Tiotropium Bromide)  Recommendations: - Ocean nasal spray twice a day- 1 spray per nostril (over the counter) - AYR nasal gel (over the counter) - Stop Afrin   Orders: - Check oxygen level on POC - titrate liter flow to keep oxygen >88%  - Place order to DME company if patient qualifies   Follow-up: - 3 months with Dr. Sissy Hoff, Adult A nosebleed is when blood comes out of the nose. Nosebleeds are common. Usually, they are not a sign of a serious condition. Nosebleeds can happen if a small blood vessel in your nose starts to bleed or if the lining of your nose (mucous membrane) cracks. They are commonly caused by:  Allergies.  Colds.  Picking your nose.  Blowing your nose too hard.  An injury from sticking an object into your nose or getting hit in the nose.  Dry or cold air. Less common causes of nosebleeds include:  Toxic fumes.  Something abnormal in the nose or in the air-filled spaces in the bones of the face (sinuses).  Growths in the nose, such as polyps.  Medicines or conditions that cause blood to clot slowly.  Certain illnesses or procedures that irritate or dry out the nasal passages. Follow these instructions at home: When you have a nosebleed:   Sit down and tilt your head slightly forward.  Use a clean towel or tissue to pinch your nostrils under the bony part of your nose. After 10 minutes, let go of your nose and see if bleeding starts again. Do not release pressure before that time. If there is still bleeding, repeat the pinching and holding for 10 minutes until the bleeding stops.  Do not place tissues or gauze in the nose to stop bleeding.  Avoid lying down and avoid tilting your head backward. That may make blood collect in the throat and cause gagging or coughing.  Use a nasal spray decongestant to help with a  nosebleed as told by your health care provider.  Do not use petroleum jelly or mineral oil in your nose. It can drip into your lungs. After a nosebleed:  Avoid blowing your nose or sniffing for a number of hours.  Avoid straining, lifting, or bending at the waist for several days. You may resume other normal activities as you are able.  Use saline spray or a humidifier as told by your health care provider.  Aspirinand blood thinners make bleeding more likely. If you are prescribed these medicines and you suffer from nosebleeds: ? Ask your health care provider if you should stop taking the medicines or if you should adjust the dose. ? Do not stop taking medicines that your health care provider has recommended unless told by your health care provider.  If your nosebleed was caused by dry mucous membranes, use over-the-counter saline nasal spray or gel. This will keep the mucous membranes moist and allow them to heal. If you must use a lubricant: ? Choose one that is water-soluble. ? Use only as much as you need and use it only as often as needed. ? Do not lie down until several hours after you use it. Contact a health care provider if:  You have a fever.  You get nosebleeds often or more often than usual.  You bruise very easily.  You have a nosebleed from having something stuck in your nose.  You have  bleeding in your mouth.  You vomit or cough up brown material.  You have a nosebleed after you start a new medicine. Get help right away if:  You have a nosebleed after a fall or a head injury.  Your nosebleed does not go away after 20 minutes.  You feel dizzy or weak.  You have unusual bleeding from other parts of your body.  You have unusual bruising on other parts of your body.  You become sweaty.  You vomit blood. This information is not intended to replace advice given to you by your health care provider. Make sure you discuss any questions you have with your health  care provider. Document Revised: 02/16/2018 Document Reviewed: 06/03/2016 Elsevier Patient Education  [place].

## 2020-06-28 NOTE — Telephone Encounter (Signed)
Great, thanks

## 2020-06-28 NOTE — Assessment & Plan Note (Signed)
-   Recommend ocean nasal spray twice daily and ARY nasal gel - If continued needs referral to ENT

## 2020-07-03 ENCOUNTER — Ambulatory Visit: Payer: PPO

## 2020-07-03 ENCOUNTER — Telehealth: Payer: PPO

## 2020-07-03 DIAGNOSIS — E782 Mixed hyperlipidemia: Secondary | ICD-10-CM

## 2020-07-03 DIAGNOSIS — I10 Essential (primary) hypertension: Secondary | ICD-10-CM

## 2020-07-03 NOTE — Chronic Care Management (AMB) (Signed)
Chronic Care Management Pharmacy  Name: Russell Marquez.  MRN: [id number] DOB: 1941-02-08  Chief Complaint/ HPI  Russell Marquez.,  79 y.o. , male presents for their Follow-Up CCM visit with the clinical pharmacist via telephone due to COVID-19 Pandemic.  PCP : Lillard Anes, MD  Their chronic conditions include: HTN, Emphysema, COPD, CKD, HLD, Tremor, depression, Enlarged prostate, and prostate cancer.   Office Visits: 05/25/2020 - Mild anemia, wbc low also, glucose 102, kidney tests normal, liver tests normal, Cholesterol normal. Refill Spiriva respimat.  01/26/2020 - Chronic dz visit. No anemia, kidney function stable, cholesterol normal, PSA WNL.   01/09/2020 - referred to eye doctor for blurred vision.  12/27/2019 -  Follow-up for major depression symptoms. No med changes.  12/13/2019 - Seen for symptoms of major depression. Started sertraline.   11/07/2019 - Chronic visit. Virtussin renewed.     Consult Visit: 06/28/2020 - Pulmonology - begin using ayr gel or ocean nasal spray for nose bleeds. Stop Spiriva and begin Stiolto. Refer to ENT if nose bleeds continue.  04/27/2020 -  PSA undetectable. Continue denosumab shot monthly. Add Calcium with d for goal of 1200 mg/day.   04/04/2020 - Pulmonology - Patient did not have any significant obstructive sleep apnea.  This is good news.  Patient did require 1 L of O2 at night.  Please continue to use 1 L of O2 at night as indicated based off the sleep study. 04/01/2020 - Sleep Med - sleep study 03/19/2020 - continue stiolto daily.  02/21/2020 - Overnight sleep study determined patient needed 2L of O2 overnight.  02/14/2020 - Pulmonology ordered CT scan of lungs to evaluate scarring. Continue Stiolto respimat. Recommended Pneumovax 23, flu and COVID 19 vaccine.  12/30/2019 - Dr. Comer Locket for malignant neoplasm of prostate. 12/15/2019 - Pulmonology started Stiolto.  09/01/2019 - Malignant neoplasm    Medications: Outpatient Encounter Medications as of 07/03/2020  Medication Sig   albuterol (VENTOLIN HFA) 108 (90 Base) MCG/ACT inhaler Inhale 2 puffs into the lungs every 4 (four) hours as needed for wheezing or shortness of breath.   atorvastatin (LIPITOR) 40 MG tablet TAKE 1 TABLET BY MOUTH ONCE DAILY   bicalutamide (CASODEX) 50 MG tablet    Calcium Carbonate-Vitamin D (CALCIUM 500 + D) 500-125 MG-UNIT TABS Take 1 tablet by mouth in the morning and at bedtime.   Calcium Citrate-Vitamin D (CITRACAL PETITES/VITAMIN D) 200-250 MG-UNIT TABS Take 3 tablets by mouth in the morning and at bedtime.   oxybutynin (DITROPAN) 5 MG tablet Take 1 tablet (5 mg total) by mouth 3 (three) times daily.   OXYGEN Inhale 2 L into the lungs.   sertraline (ZOLOFT) 50 MG tablet Take 50 mg by mouth daily.   Tiotropium Bromide-Olodaterol 2.5-2.5 MCG/ACT AERS Inhale 2 puffs into the lungs daily.   valsartan (DIOVAN) 80 MG tablet    XTANDI 40 MG capsule Take 160 mg by mouth daily.   No facility-administered encounter medications on file as of 07/03/2020.     Current Diagnosis/Assessment:  Goals Addressed            This Visit's Progress    Pharmacy Care Plan       CARE PLAN ENTRY  Current Barriers:   Chronic Disease Management support, education, and care coordination needs related to HTN, HLD, and COPD  Pharmacist Clinical Goal(s):   Ensure safety, efficacy, and affordability of medications  Recommend maintaining BP <140/90 mmHg   Goal Cholesterol: TC <200, LDL <100, TG <150  HDL >50 mg/dL  Interventions:  Comprehensive medication review performed.  Recommend engaging in moderate intensity exercise 5 times each week.   Recommend following the [employer] guidance of incorporating fruits, vegetables, whole-grains, low-fat dairy products, lean meats, legumes and nuts for optimal health.  Recommend keeping sodium intake less than 1500 mg/day.   Recommend continue increased dose of  oxybutynin to better treat symptoms of frequent urination.    Recommend working to eat frequent small meals with protein source to help slow down weight loss.   Patient Self Care Activities:   Patient will work to eat small meals throughout the day to avoid further weight loss.   Patient indicates that he would like to begin building up his walking.   Please see past updates related to this goal by clicking on the "Past Updates" button in the selected goal         COPD / Asthma / Tobacco   Eosinophil count:  No results found for: EOSPCT%                               Eos (Absolute):  Lab Results  Component Value Date/Time   EOSABS 0.1 05/25/2020 10:26 AM    Tobacco Status:  Social History   Tobacco Use  Smoking Status Former Smoker   Packs/day: 1.00   Years: 27.00   Pack years: 27.00   Types: Cigarettes   Start date: 12/01/1956   Quit date: 12/01/1993   Years since quitting: 26.6  Smokeless Tobacco Never Used    Patient has failed these meds in past: anoro inhale, spiriva respimat Patient is currently controlled on the following medications: albuterol inhaler, Stiolto respimat, oxygen Using maintenance inhaler regularly? Yes Frequency of rescue inhaler use:  2-3x per week  We discussed:  Patient is seeing pulmonology to address breathing issues. He says they are still doing scans and test to determine the cause of breathing issues. Patient hopes to begin building up stamina to walk and participate in light exercise.   Update 05/31/2020 - Patient indicates that Spiriva is covering his symptoms well. He is on oxygen at home. No issues reported at this time.   Update 07/03/2020 - Patient has some nose bleeds. He went to pulmonology and they didn't find a strong source. Pulmonology feels this is coming from oxygen therapy. He will follow-up with ENT if not improved. Pulmonology is counseling on appropriate ways to stop it by leaning forward, pinching nose and using  Ocean nasal spray to reduce nose bleeds. Patient reports good control with stiolto. Patient reports Stiolto is affordable with insurance (Copay = $0)/   Plan  Continue current medications  and  Hypertension    Office blood pressures are  BP Readings from Last 3 Encounters:  06/28/20 (!) 130/70  05/25/20 130/80  03/19/20 130/76    Patient has failed these meds in the past: valsartan-hctz Patient is currently controlled on the following medications: Valsartan 80 mg   Patient checks BP at home weekly  Patient home BP readings are ranging: 128/80  We discussed diet and exercise extensively. Patient is working to reduce sodium in diet.   Update 05/31/2020 - Patient reports good control and compliance with therapy.  Update 07/03/2020 - Patient reports continued good blood pressure and compliance with therapy.    Plan  Continue current medications   Hyperlipidemia   Lipid Panel     Component Value Date/Time   CHOL 127 05/25/2020  1026   TRIG 100 05/25/2020 1026   HDL 44 05/25/2020 1026   CHOLHDL 2.9 05/25/2020 1026   LDLCALC 64 05/25/2020 1026   LABVLDL 19 05/25/2020 1026     The ASCVD Risk score (Goff DC Jr., et al., 2013) failed to calculate for the following reasons:   The valid total cholesterol range is 130 to 320 mg/dL   Patient has failed these meds in past: n/a Patient is currently controlled on the following medications: atorvastatin 40 mg   We discussed:  diet and exercise extensively. Patient indicates that he is eating more chicken. He no longer drinks alcohol and is working to reduce red meat.  Update 05/31/2020 - Patient reports good control and compliance with therapy.   07/03/2020 - Patient is concerned with recent weight loss. Patient eats bacon and eggs for breakfast, sandwich or soup for lunch and light supper in the evening. Discussed cottage cheese as a protein source and the option of small meals throughout the day. He feels that his appetite is  less. He has backed down on red meat due to cholesterol. He enjoys lean pork.   Plan  Continue current medications   Depression   Patient has failed these meds in past: zoloft Patient is currently controlled on the following medications: n/a currently  We discussed:  Patient did not refill sertraline. He reports that crying and nervousness is much better since he is staying at home a little more. His daughter from [place] is staying with him to help him care for his sister. Patient indicates that he is feeling much better.   Update 05/31/2020 - Patient reports that stress of caregiver is still there but managing well.   Update 07/03/2020 - Patient reports that his stress/depression is improved.   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 His oncologist has recently retired and is seeing a new one. He reports that medications are both affordable and seem to be helping.   Update 05/31/2020 - Patient is experiencing worsening of bladder symptoms lately. He can't drive for very long without having to pull over to urinate. He is waking up many times during the night and lifestyle limited by frequent urination.   Update 07/03/2020 - Patient reports improved symptoms with increased dose. He is able to rest better at night not getting up as frequently. He can now drive 1 hour without stopping on the side of the road. Denies side effects at this time.    Plan  Continue current medication.   Gout   Patient has failed these meds in past: allopurinol Patient is currently controlled on the following medications: n/a  We discussed: Patient is not currently dealing with Gout. We discussed that  his improved diet is also helping prevent future Gout episodes. Not currently treating preventatively.   Update 05/31/2020 - Patient well controlled at this time.   Update 07/03/2020 - Good control. No flares.   Plan  Continue control with diet.   Vaccines   Reviewed and discussed patient's vaccination history. Patient has previously declined COVID and Pneumonia vaccine recommendation from pulmonology. We discussed risk/benefits of vaccines. He plans to let them know at his visit tomorrow that he would like to receive.   Plan  Recommended patient receive COVID, Flu and Pneumonia vaccine.   Health Maintenance   Patient is currently controlled on the following medications:   Takes naproxen very seldom OTC to treat pain or headache.   We discussed:  diet and exercise extensively   Update 05/31/2020 - Patient has been started on Calcium with D (1200 mg/day of calcium) due to monthly denosumab shot at cancer center. Patient has been tolerating well but dislikes the 6 tablets daily. Patient purchased the petite sized calcium due to easily swallowed. Patient is aware of daily recommended calcium dose.   Plan  Continue current medications   Medication Management   Pt uses [name] for all medications and uses a weekly pill planner to keep medications organized.  Pt endorses good compliance - rarely misses doses.   We discussed: Patient's good adherence aids already in place.   Update 05/31/2020 - Patient will pick up prescription at pharmacy today.   Plan Patient to continue taking medications as prescribed.   Follow up: 5 month with Pharmacist

## 2020-07-03 NOTE — Patient Instructions (Addendum)
Visit Information  Goals Addressed            This Visit's Progress   . Pharmacy Care Plan       CARE PLAN ENTRY  Current Barriers:  . Chronic Disease Management support, education, and care coordination needs related to HTN, HLD, and COPD  Pharmacist Clinical Goal(s):  . Ensure safety, efficacy, and affordability of medications . Recommend maintaining BP <140/90 mmHg  . Goal Cholesterol: TC <200, LDL <100, TG <150 HDL >50 mg/dL  Interventions: . Comprehensive medication review performed. . Recommend engaging in moderate intensity exercise 5 times each week.  . Recommend following the [employer] diet guidance of incorporating fruits, vegetables, whole-grains, low-fat dairy products, lean meats, legumes and nuts for optimal health. . Recommend keeping sodium intake less than 1500 mg/day.  . Recommend continue increased dose of oxybutynin to better treat symptoms of frequent urination.   . Recommend working to eat frequent small meals with protein source to help slow down weight loss.   Patient Self Care Activities:  . Patient will work to eat small meals throughout the day to avoid further weight loss.  . Patient indicates that he would like to begin building up his walking.   Please see past updates related to this goal by clicking on the "Past Updates" button in the selected goal         The patient verbalized understanding of instructions provided today and declined a print copy of patient instruction materials.   Telephone follow up appointment with pharmacy team member scheduled for: 12/2020  Sherre Poot, PharmD, [facility] Clinical Pharmacist [facility] [phone] (office) [phone] (mobile)   Protein Content in Foods  Generally, most healthy people need around 50 grams of protein each day. Depending on your overall health, you may need more or less protein in your diet. Talk to your health care provider or dietitian about how much protein you  need. See the following list for the protein content of some common foods. High-protein foods High-protein foods contain 4 grams (4 g) or more of protein per serving. They include:  Beef, ground sirloin (cooked) -- 3 oz have 24 g of protein.  Cheese (hard) -- 1 oz has 7 g of protein.  Chicken breast, boneless and skinless (cooked) -- 3 oz have 13.4 g of protein.  Cottage cheese -- 1/2 cup has 13.4 g of protein.  Egg -- 1 egg has 6 g of protein.  Fish, filet (cooked) -- 1 oz has 6-7 g of protein.  Garbanzo beans (canned or cooked) -- 1/2 cup has 6-7 g of protein.  Kidney beans (canned or cooked) -- 1/2 cup has 6-7 g of protein.  Lamb (cooked) -- 3 oz has 24 g of protein.  Milk -- 1 cup (8 oz) has 8 g of protein.  Nuts (peanuts, pistachios, almonds) -- 1 oz has 6 g of protein.  Peanut butter -- 1 oz has 7-8 g of protein.  Pork tenderloin (cooked) -- 3 oz has 18.4 g of protein.  Pumpkin seeds -- 1 oz has 8.5 g of protein.  Soybeans (roasted) -- 1 oz has 8 g of protein.  Soybeans (cooked) -- 1/2 cup has 11 g of protein.  Soy milk -- 1 cup (8 oz) has 5-10 g of protein.  Soy or vegetable patty -- 1 patty has 11 g of protein.  Sunflower seeds -- 1 oz has 5.5 g of protein.  Tofu (firm) -- 1/2 cup has 20 g of protein.  Tuna (canned in water) -- 3 oz has 20 g of protein.  Yogurt -- 6 oz has 8 g of protein. Low-protein foods Low-protein foods contain 3 grams (3 g) or less of protein per serving. They include:  Beets (raw or cooked) -- 1/2 cup has 1.5 g of protein.  Bran cereal -- 1/2 cup has 2-3 g of protein.  Bread -- 1 slice has 2.5 g of protein.  Broccoli (raw or cooked) -- 1/2 cup has 2 g of protein.  Collard greens (raw or cooked) -- 1/2 cup has 2 g of protein.  Corn (fresh or cooked) -- 1/2 cup has 2 g of protein.  Cream cheese -- 1 oz has 2 g of protein.  Creamer (half-and-half) -- 1 oz has 1 g of protein.  Flour tortilla -- 1 tortilla has 2.5 g of  protein  Frozen yogurt -- 1/2 cup has 3 g of protein.  Fruit or vegetable juice -- 1/2 cup has 1 g of protein.  Green beans (raw or cooked) -- 1/2 cup has 1 g of protein.  Green peas (canned) -- 1/2 cup has 3.5 g of protein.  Muffins -- 1 small muffin (2 oz) has 3 g of protein.  Oatmeal (cooked) -- 1/2 cup has 3 g of protein.  Potato (baked with skin) -- 1 medium potato has 3 g of protein.  Rice (cooked) -- 1/2 cup has 2.5-3.5 g of protein.  Sour cream -- 1/2 cup has 2.5 g of protein.  Spinach (cooked) -- 1/2 cup has 3 g of protein.  Squash (cooked) -- 1/2 cup has 1.5 g of protein. Actual amounts of protein may be different depending on processing. Talk with your health care provider or dietitian about what foods are recommended for you. This information is not intended to replace advice given to you by your health care provider. Make sure you discuss any questions you have with your health care provider. Document Revised: 07/28/2016 Document Reviewed: 07/28/2016 Elsevier Patient Education  2020 [employer].

## 2020-07-30 ENCOUNTER — Other Ambulatory Visit: Payer: Self-pay | Admitting: Legal Medicine

## 2020-08-01 DEATH — deceased

## 2020-08-09 ENCOUNTER — Other Ambulatory Visit: Payer: Self-pay | Admitting: Legal Medicine

## 2020-09-01 IMAGING — CT CT CHEST HIGH RESOLUTION W/O CM
2 of 8 series · 14 of 36 positions shown, 17 images · non-contrast
Comparison: None.

CLINICAL DATA: Shortness of breath, smoking history

EXAM:
CT CHEST WITHOUT CONTRAST
TECHNIQUE: Multidetector CT imaging of the chest was performed following the
standard protocol without intravenous contrast. High resolution
imaging of the lungs, as well as inspiratory and expiratory imaging,
was performed.

[Series 10: coronal · coronal · 0.66mm/px · 3 of 135 slices shown]
[im 27/135  lung]
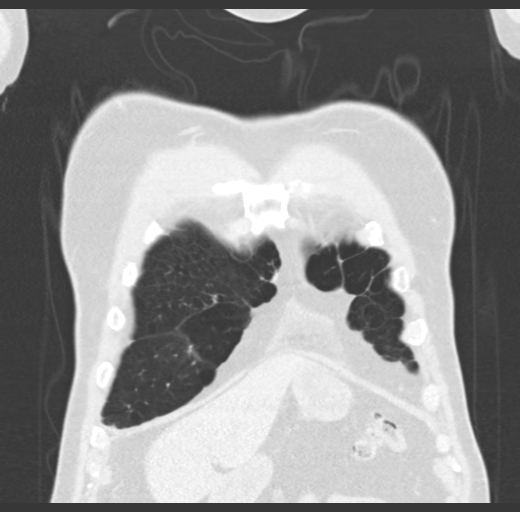
[im 54/135  lung]
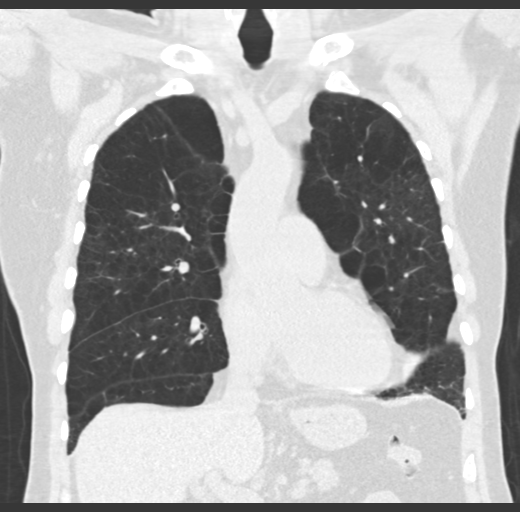
[im 81/135  lung]
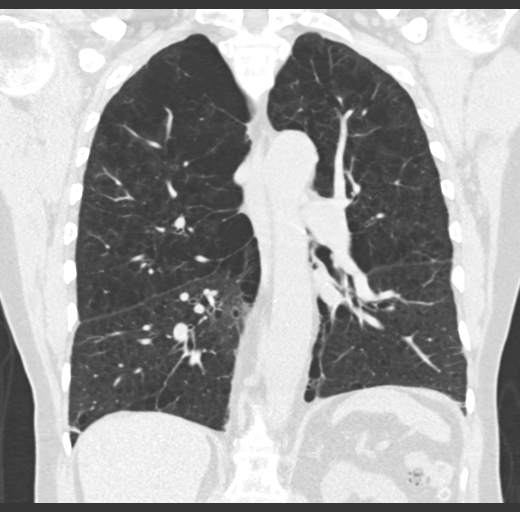

[Series 12: high resolution · axial · 0.64mm/px · z∈[-358,-88]mm · 11 of 325 slices shown, 14 images]
[im 28/325  mediastinal]
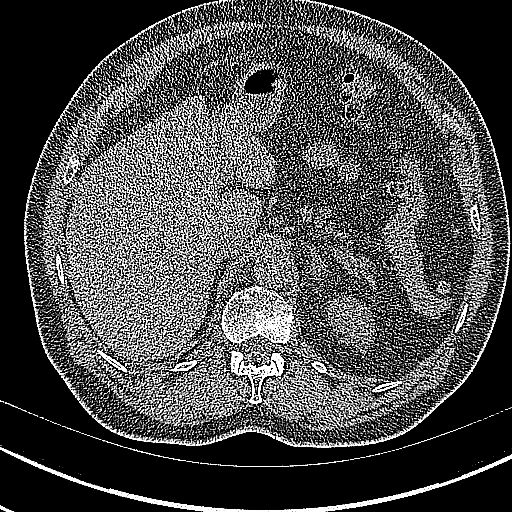
[im 28/325  lung]
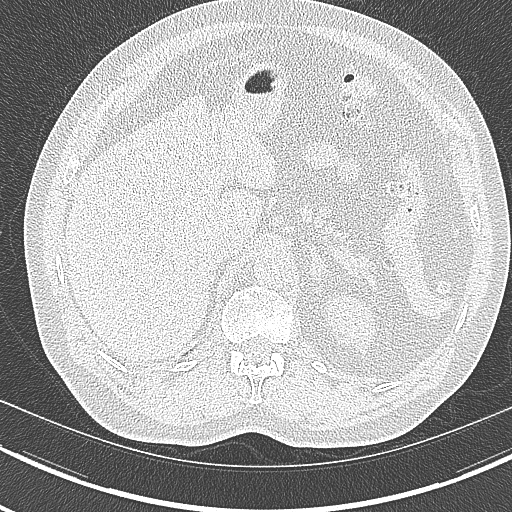
[im 55/325  lung]
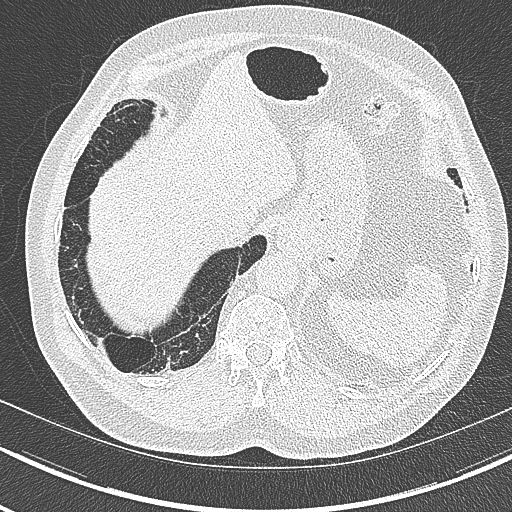
[im 82/325  lung]
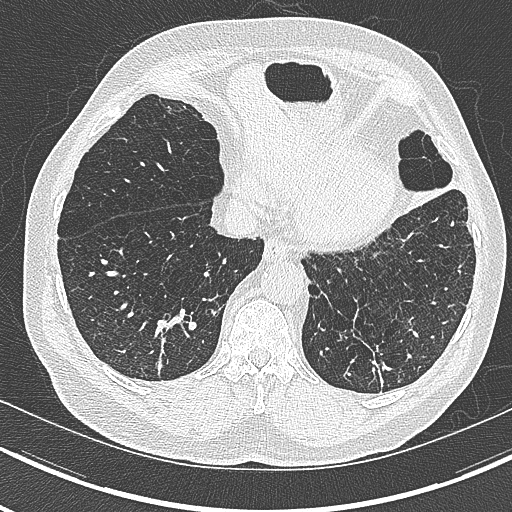
[im 109/325  lung]
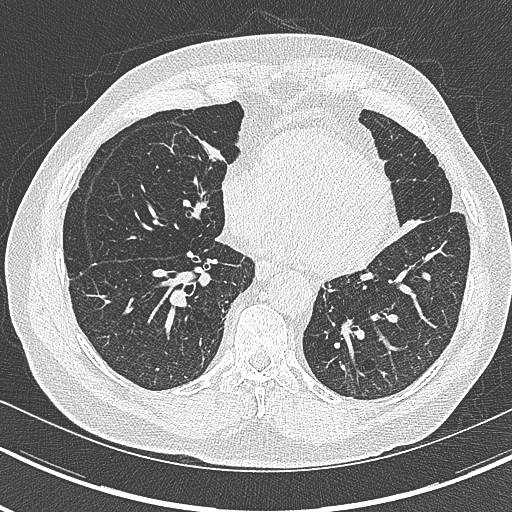
[im 136/325  mediastinal]
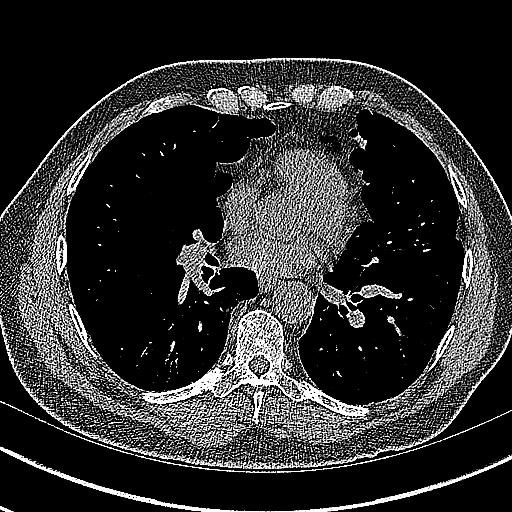
[im 136/325  lung]
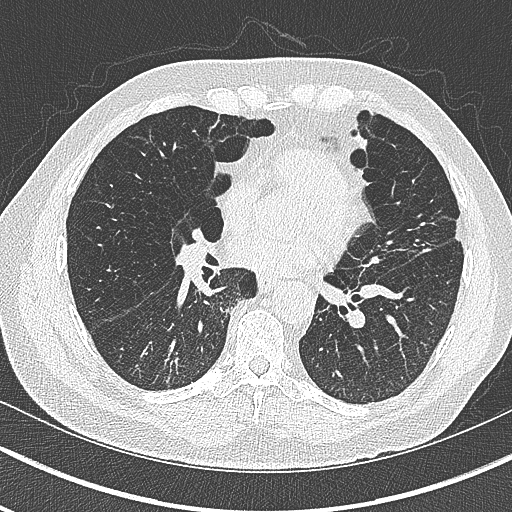
[im 163/325  lung]
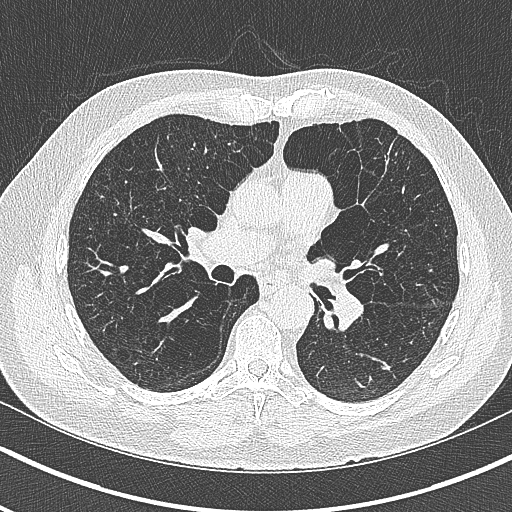
[im 190/325  lung]
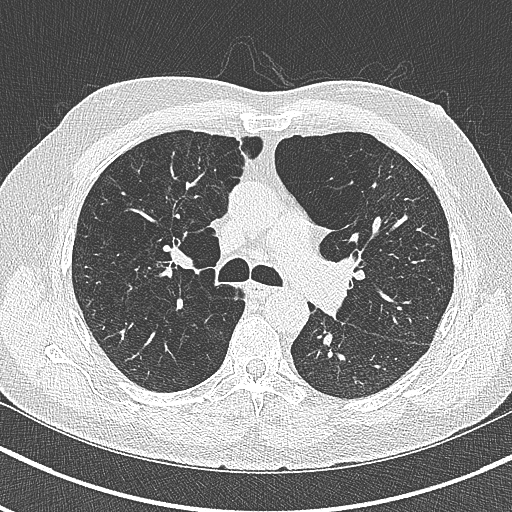
[im 217/325  lung]
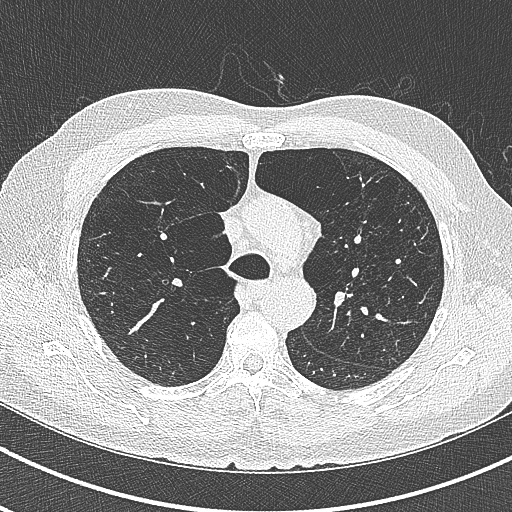
[im 244/325  mediastinal]
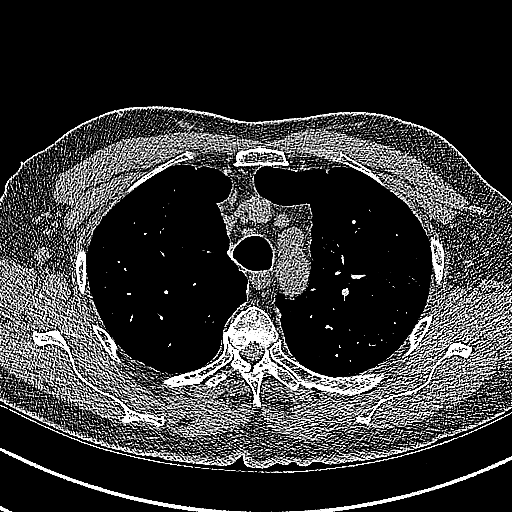
[im 244/325  lung]
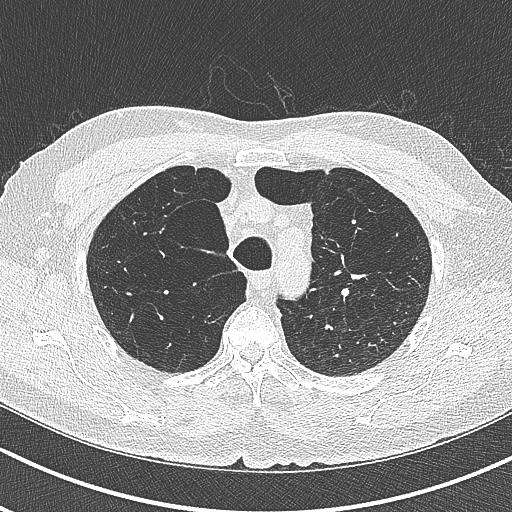
[im 271/325  lung]
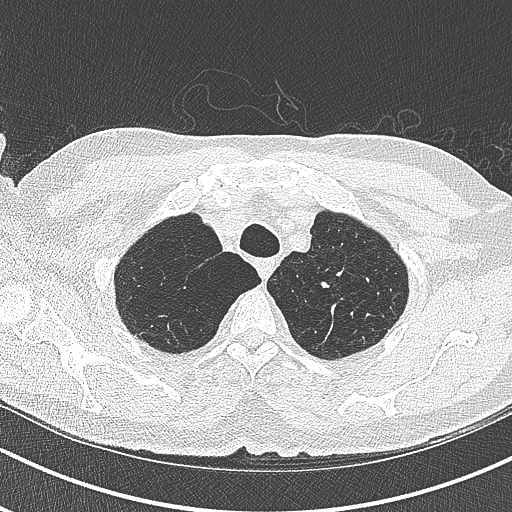
[im 298/325  lung]
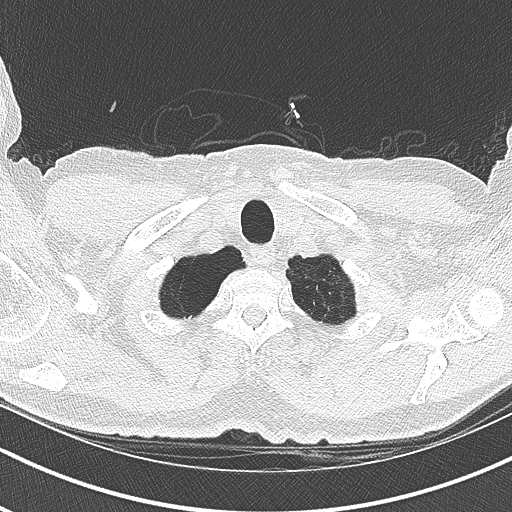

[14 of 36 positions shown; findings below may reference images not displayed]

FINDINGS: Cardiovascular: Scattered aortic atherosclerosis. Normal heart size.
Scattered coronary artery calcifications. No pericardial effusion.

Mediastinum/Nodes: No enlarged mediastinal, hilar, or axillary lymph
nodes. Low-attenuation nodule of the left lobe of the thyroid
measuring 2.1 cm (series 2, image 7). Trachea, and esophagus
demonstrate no significant findings.

Lungs/Pleura: Severe centrilobular emphysema. Mild dependent
bibasilar scarring, predominantly bandlike. No significant air
trapping on expiratory phase imaging. No pleural effusion or
pneumothorax.

Upper Abdomen: No acute abnormality.  Colonic diverticulosis.

Musculoskeletal: Numerous scattered, subtly sclerotic osseous
metastatic lesions.
IMPRESSION: 1.  Severe emphysema.  Aortic Atherosclerosis ([id number].Q).

2. Mild, bland appearing dependent bibasilar scarring. No evidence
of fibrotic interstitial lung disease.

3.  Coronary artery disease.  Aortic Atherosclerosis ([id number].Q).

4. Numerous scattered, subtly sclerotic osseous metastatic lesions,
in keeping with known metastatic prostate cancer.

## 2020-09-28 ENCOUNTER — Ambulatory Visit: Payer: PPO | Admitting: Legal Medicine

## 2020-12-13 ENCOUNTER — Telehealth: Payer: PPO
